# Patient Record
Sex: Male | Born: 1995 | Marital: Single | Smoking: Never smoker
Health system: Southern US, Community
[De-identification: ages and names within clinical notes are randomized; demographics above are authoritative.]

## PROBLEM LIST (undated history)

## (undated) DIAGNOSIS — T7840XA Allergy, unspecified, initial encounter: Secondary | ICD-10-CM

## (undated) HISTORY — DX: Allergy, unspecified, initial encounter: T78.40XA

---

## 2018-01-08 ENCOUNTER — Ambulatory Visit
Admission: RE | Admit: 2018-01-08 | Discharge: 2018-01-08 | Disposition: A | Discharge status: Home / Self Care | Payer: 59 | Source: Ambulatory Visit | Attending: Family Medicine | Admitting: Family Medicine

## 2018-01-08 ENCOUNTER — Other Ambulatory Visit: Payer: Self-pay | Admitting: Family Medicine

## 2018-01-08 ENCOUNTER — Ambulatory Visit
Admission: RE | Admit: 2018-01-08 | Discharge: 2018-01-08 | Disposition: A | Discharge status: Home / Self Care | Payer: 59 | Source: Ambulatory Visit | Attending: Radiology | Admitting: Radiology

## 2018-01-08 DIAGNOSIS — M7989 Other specified soft tissue disorders: Secondary | ICD-10-CM | POA: Insufficient documentation

## 2018-01-08 DIAGNOSIS — S99911A Unspecified injury of right ankle, initial encounter: Secondary | ICD-10-CM | POA: Insufficient documentation

## 2018-01-08 DIAGNOSIS — M25571 Pain in right ankle and joints of right foot: Secondary | ICD-10-CM | POA: Insufficient documentation

## 2018-01-08 DIAGNOSIS — X58XXXA Exposure to other specified factors, initial encounter: Secondary | ICD-10-CM | POA: Diagnosis not present

## 2018-02-09 ENCOUNTER — Ambulatory Visit: Payer: 59 | Attending: Medical

## 2018-02-09 DIAGNOSIS — M25671 Stiffness of right ankle, not elsewhere classified: Secondary | ICD-10-CM | POA: Diagnosis present

## 2018-02-09 NOTE — Therapy (Signed)
[facility] MAIN [facility] SERVICES [address] [place], [place], [address] Phone: [phone]   Fax:  [phone]  Physical Therapy Evaluation  Patient Details  Name: Preston Salinas MRN: [id number] Date of Birth: 12-30-1995 Referring Provider (PT): Jonathon Resides PA-C    Encounter Date: 02/09/2018  PT End of Session - 02/09/18 1721    Visit Number  1    Number of Visits  8    Date for PT Re-Evaluation  03/12/18    Authorization Time Period  02/09/18-03/12/18    PT Start Time  1600    PT Stop Time  1652    PT Time Calculation (min)  52 min    Activity Tolerance  Patient tolerated treatment well;No increased pain    Behavior During Therapy  [facility] for tasks assessed/performed       No past medical history on file.   There were no vitals filed for this visit.   Subjective Assessment - 02/09/18 1710    Subjective  Pt reports landing on his right foot in an inverted position after coming down from a jump, rolling his ankle and sustaining an inversion sprain. This even occurred on 01/07/18, pt reports immediate swelling and hearing an audible 'pop' sound. Pt reports not playing club soccer since, and has tried running one time 2 weeks after, but had some immediate pain. Pt reports having xrays done, imaging suggestive of some 'ligamentous injury' per patient report. Pt denies any prior injury to the Right ankle, but questionable prior left ankle sprain. Pt was initially on crutches, no boot use. Pt then was walking fine thereafter without crutches. Pt adopted an ASO style brace for walking, has not had any limitations getting around campus. Pt has been avoiding most of his lower extremity conditioning at the gym.    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     Limitations  Other (comment)   unable to run d/t pain   How long can you sit comfortably?  unlimited     How long can you stand comfortably?  unlimited     How long can you  walk comfortably?  unlimited     Diagnostic tests  xray negative for fracture     Patient Stated Goals  His goal is to be able to return to playing club soccer in the spring. Pt also would like to be able to return to running for fitness, typically around 2 miles, 3-5x weekly.     Currently in Pain?  No/denies         [facility] PT Assessment - 02/09/18 0001      Assessment   Medical Diagnosis  Right inversion ankle sprain    Referring Provider (PT)  Jonathon Resides PA-C     Onset Date/Surgical Date  01/07/18    Next MD Visit  as needed     Prior Therapy  none      Precautions   Precautions  None    Required Braces or Orthoses  --   none mandated, but pt wearing  acompression sleeve     Prior Function   Level of Independence  Independent    Vocation  Student    Vocation Requirements  SItting, Walking long distances     Leisure  club soccer, running for fitness         Examination:   A/ROM -Left Ankle Dorsiflexion: 20 degrees; Plantar flexion: 55 degrees  -Right Ankle Dorsiflexion: 21 degrees; Plantar flexion:  32 degrees   *pain at the anterior ankle capsule with plantar flexion P/ROM  -Right Ankle Dorsiflexion: 32 degrees -Left Ankle Dorsiflexion: 38 degrees   STRENGTH ASSESSMENT  -Ankle DF MMT: 5/5  -Seated Hip IR/ER MMT: 5/5 pain free, Bilaterally.   -Single Leg Heel Raise to failure:   Left: 35+x (appears near failure c subjective agreement);   Right: 35+x (minimal to no pain)  -Single Leg stance (wall leaning) ankle dorsiflexion to failure: Left 6x, Right 9x (heels 18-inches from wall)   -Figure 8-measurement of edema  Left ankle: 50cm, Right ankle 51cm  BALANCE/PROPRIOCEPTIVE -Single Leg Stance Balance: 30+sec Left , 30+sec Right (denies pain)  -Single Leg Stance Balance- Eyes closed: Left, 30+sec (mild instability s LOB); Right: 29sec c LOB -Single Leg Stance Balance- Airex Foam (eyes open); Left:30sec; Right 30sec c mild increased intrinsic motor activity  and soreness  Objective measurements completed on examination: See above findings.      [facility] Adult PT Treatment/Exercise - 02/09/18 0001      Exercises   Exercises  Ankle      Manual Therapy   Manual Therapy  Joint mobilization    Manual therapy comments  Ankle DIstraction Mobilization: 1x60 seconds force as tolerated    Joint Mobilization  Ankle Dorsal Glide + Dorsiflexion Grade III 1x30sec       Ankle Exercises: Stretches   Other Stretch  Seated Sagittal Plane Heel Slides:    10x5sec stretch in DF and PF   Other Stretch  Seated Transverse Plane Heel Slides:   10x5sec Eversion and Inversion     Ankle Exercises: Seated   Ankle Circles/Pumps  20 reps;AROM;Right   20x CW; 20x CCW            PT Education - 02/09/18 1716    Education Details  Explained safe use of ice as needed if swelling or soreness are provoked by evaluation or HEP. Asked to refrain from running at this time.     Person(s) Educated  Patient    Methods  Explanation    Comprehension  Verbalized understanding;Need further instruction          PT Long Term Goals - 02/09/18 1729      PT LONG TERM GOAL #1   Title  After 4 weeks patient will be independent in HEP for return to sport agility and single leg dynamic stability activity.     Status  New      PT LONG TERM GOAL #2   Title  After 4 weeks pt will be demonstrate understanding of return to running program as educated by PT.     Status  New      PT LONG TERM GOAL #3   Title  After 4 weeks patient will demonstrate symmetrical ankle dorsiflexion and plantar flexion ROM within 3 degrees of each.     Status  New      PT LONG TERM GOAL #4   Title  After 4 weeks patient will demonstrate medial and lateral single leg hop distances on the right leg within 10% of distances the left leg with stick landing.     Status  New             Plan - 02/09/18 1722    Clinical Impression Statement  Pt referred to Physical Therapy for a right ankle  inversion sprain 4W post injury, imaging negative for acute fracture. Pt endorses continued swelling, inability to participate in soccer, inability to run without pain. Examination reveal  remarkably high funcitoning in the right ankle complex in terms of strenth, proprrioception, and motor control. The patient continues to have mild swelling, as well as ROM limitations plantarflexion>dorsiflexion. Pt will benefit from skilled PT intervention to restrore Right ankle function to prior level of funciton in order to propare for return to running and game play.     Clinical Presentation  Stable    Clinical Presentation due to:  clinical tests and measures     Clinical Decision Making  Low    Rehab Potential  Excellent    PT Frequency  1x / week    PT Duration  4 weeks    PT Treatment/Interventions  Cryotherapy;Electrical Stimulation;Moist Heat;Functional mobility training;Gait training;Therapeutic activities;Therapeutic exercise;Patient/family education;Balance training;Manual techniques;Passive range of motion;Dry needling    PT Next Visit Plan  Y-balance Test, strength assessment of peroneals, ROM assessment of ankle inversion/eversion, HEP review, interventions to progress plantar flexion ROM, progress OKC HEP to CKC as tolerated.     PT Home Exercise Plan  *see attached handout (most OKC gentle mobility and AA/ROM activity)     Consulted and Agree with Plan of Care  Patient       Patient will benefit from skilled therapeutic intervention in order to improve the following deficits and impairments:  Decreased balance, Decreased activity tolerance, Decreased range of motion, Increased edema, Hypomobility, Pain  Visit Diagnosis: Stiffness of right ankle, not elsewhere classified - Plan: PT plan of care cert/re-cert     Problem List There are no active problems to display for this patient.  5:44 PM, 02/09/18 Rosamaria Lints, PT, DPT Physical Therapist - [facility]  Outpatient Physical Therapy- Main Campus [phone]     Rosamaria Lints 02/09/2018, 5:39 PM  [facility] MAIN [facility] SERVICES [address] [place], [place], [address] Phone: [phone]   Fax:  [phone]  Name: Preston Salinas MRN: [id number] Date of Birth: 08/17/1995

## 2018-02-12 ENCOUNTER — Encounter: Payer: 59 | Admitting: Physical Therapy

## 2018-02-18 ENCOUNTER — Ambulatory Visit: Payer: 59 | Admitting: Physical Therapy

## 2018-02-18 ENCOUNTER — Encounter: Payer: Self-pay | Admitting: Physical Therapy

## 2018-02-18 DIAGNOSIS — M25671 Stiffness of right ankle, not elsewhere classified: Secondary | ICD-10-CM | POA: Diagnosis not present

## 2018-02-18 NOTE — Patient Instructions (Signed)
Heel raises w/ ball between ankles to prevent inversion R foot soccer ball work GTB nkle PF, Eversion, Inversion

## 2018-02-18 NOTE — Therapy (Signed)
[facility] MAIN [facility] SERVICES [address] [place], [place], [address] Phone: [phone]   Fax:  [phone]  Physical Therapy Treatment  Patient Details  Name: Preston Salinas MRN: [id number] Date of Birth: 11/15/1995 Referring Provider (PT): Jonathon Resides PA-C    Encounter Date: 02/18/2018  PT End of Session - 02/18/18 1638    Visit Number  2    Number of Visits  8    Date for PT Re-Evaluation  03/12/18    Authorization Time Period  02/09/18-03/12/18    PT Start Time  1615    PT Stop Time  1645    PT Time Calculation (min)  30 min    Activity Tolerance  Patient tolerated treatment well;No increased pain    Behavior During Therapy  [facility] for tasks assessed/performed       History reviewed. No pertinent past medical history.  History reviewed. No pertinent surgical history.  There were no vitals filed for this visit.  Subjective Assessment - 02/18/18 1618    Subjective  Patient states that he is feeling much better and has returned to bodyweight exercises.     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     Limitations  Other (comment)   unable to run d/t pain   How long can you sit comfortably?  unlimited     How long can you stand comfortably?  unlimited     How long can you walk comfortably?  unlimited     Diagnostic tests  xray negative for fracture     Patient Stated Goals  His goal is to be able to return to playing club soccer in the spring. Pt also would like to be able to return to running for fitness, typically around 2 miles, 3-5x weekly.     Currently in Pain?  No/denies       TREATMENT  Therapeutic Exercise: (all exercises done barefoot) R Towel In/eversion x10 each B Heel Raises x10 B Heel raises w/ ball between ankles x30 R Ankle 3-way, GTB x5 each R Soccer ball touches (laces/cleats or dorsal/plantar) x30  Manual Therapy: Proximal Tib-fib AP gr 3 glides x30 sec bout, gr 2 glides x30 sec  bout Distal Tib-fib AP and superior gr 3 glides 2 x30 sec bout TC joint distraction, gentle x10 TC PA glide gr 3, x30 sec bout Subtalar Medial and Lateral glides gr 3, 2 x 30 sec bouts each  Patient reported decreased pain after manuals.    PT Long Term Goals - 02/09/18 1729      PT LONG TERM GOAL #1   Title  After 4 weeks patient will be independent in HEP for return to sport agility and single leg dynamic stability activity.     Status  New      PT LONG TERM GOAL #2   Title  After 4 weeks pt will be demonstrate understanding of return to running program as educated by PT.     Status  New      PT LONG TERM GOAL #3   Title  After 4 weeks patient will demonstrate symmetrical ankle dorsiflexion and plantar flexion ROM within 3 degrees of each.     Status  New      PT LONG TERM GOAL #4   Title  After 4 weeks patient will demonstrate medial and lateral single leg hop distances on the right leg within 10% of distances the left leg with stick landing.  Status  New         Plan - 02/18/18 1906    Clinical Impression Statement  Patient presents to clinic with improved function and activity tolerance and was amenable to therapy. Patient continues to demonstrate deficits in R ankle strength and motor control as a result of R ankle inversion sprain. Patient will benefit from continued skilled therapeutic intervention to address deficits in strength, motor control, and function in order to return to sport and improve overall QOL.     Rehab Potential  Excellent    PT Frequency  1x / week    PT Duration  4 weeks    PT Treatment/Interventions  Cryotherapy;Electrical Stimulation;Moist Heat;Functional mobility training;Gait training;Therapeutic activities;Therapeutic exercise;Patient/family education;Balance training;Manual techniques;Passive range of motion;Dry needling    PT Next Visit Plan  Y-balance Test, strength assessment of peroneals, ROM assessment of ankle inversion/eversion, HEP  review, interventions to progress plantar flexion ROM, progress OKC HEP to CKC as tolerated.     PT Home Exercise Plan  *see attached handout (most OKC gentle mobility and AA/ROM activity)     Consulted and Agree with Plan of Care  Patient       Patient will benefit from skilled therapeutic intervention in order to improve the following deficits and impairments:  Decreased balance, Decreased activity tolerance, Decreased range of motion, Increased edema, Hypomobility, Pain  Visit Diagnosis: Stiffness of right ankle, not elsewhere classified     Problem List There are no active problems to display for this patient.  Sheria Lang PT, DPT [phone] 02/18/2018, 7:16 PM  [facility] MAIN [facility] SERVICES [address] [place], [place], [address] Phone: [phone]   Fax:  [phone]  Name: Preston Salinas MRN: [id number] Date of Birth: 1995/07/07

## 2018-02-26 ENCOUNTER — Ambulatory Visit: Payer: 59

## 2018-02-26 DIAGNOSIS — M25671 Stiffness of right ankle, not elsewhere classified: Secondary | ICD-10-CM

## 2018-02-26 NOTE — Patient Instructions (Signed)
Access Code: [email or web address]  URL: [email or web address]  Date: 02/26/2018  Prepared by: Ria Comment   Exercises  Long Sitting Ankle Eversion with Resistance - 10 reps - 2 sets - 3 seconds hold - 1x daily - 7x weekly

## 2018-02-26 NOTE — Therapy (Addendum)
[facility] MAIN [facility] SERVICES [address] [place], [place], [address] Phone: [phone]   Fax:  [phone]  Physical Therapy Treatment  Patient Details  Name: Preston Salinas MRN: [id number] Date of Birth: 19-Dec-1995 Referring Provider (PT): Jonathon Resides PA-C    Encounter Date: 02/26/2018  PT End of Session - 02/26/18 1657    Visit Number  3    Number of Visits  8    Date for PT Re-Evaluation  03/12/18    Authorization Time Period  02/09/18-03/12/18    PT Start Time  1652    PT Stop Time  1730    PT Time Calculation (min)  38 min    Activity Tolerance  Patient tolerated treatment well;No increased pain    Behavior During Therapy  [facility] for tasks assessed/performed       History reviewed. No pertinent past medical history.  History reviewed. No pertinent surgical history.  There were no vitals filed for this visit.  Subjective Assessment - 02/26/18 1652    Subjective  Pt reports that he is doing well on this date. No pain currently. No specific questions or concerns. Pt reports compliance with HEP with some mild pain while performing exercises with soccer ball.     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     Limitations  Other (comment)   unable to run d/t pain   How long can you sit comfortably?  unlimited     How long can you stand comfortably?  unlimited     How long can you walk comfortably?  unlimited     Diagnostic tests  xray negative for fracture     Patient Stated Goals  His goal is to be able to return to playing club soccer in the spring. Pt also would like to be able to return to running for fitness, typically around 2 miles, 3-5x weekly.     Currently in Pain?  No/denies           TREATMENT   Therapeutic Exercise: (all exercises done barefoot) R ankle ligamentous laxity testing (see below) R Ankle 4-way, GTB x5 each Discontinued ball PF/DF juggling due to pain in anterior ankle during  PF; Airex on 6" step R single leg lateral squats with heel taps x 10, cues for very slow and controlled eccentric lowering; Airex R single leg balance with ball passes in a variety of planes to therapist 30s x 2; 1/2 foam roller tandem balance alternating forward LE 30s x 2 each; BOSU (flat side up) squats x 15; Pt issued theraband resisted R ankle eversion with dorsiflexion strengthening in upright or long sitting;   Manual Therapy: Talocrural joint distraction with oscillation 30s/bout x 3 bouts; Prone talocrural PA glide grade 2-3, 30s/bouts x 3 bouts; No pain with palpation along peroneal tendons, at base of 5th metatarsal, or at navicular; No pain along anterior tib tendons with palpation;   Ligamentous Integrity Special Tests (Right) Anterior Drawer (ATF, 10-15 plantarflexion with anterior translation): Negative Talar Tilt (CFL, inversion): Negative Eversion Stress Test (Deltoid, eversion): Negative External Rotation Test (High ankle, dorsiflexion and external rotation): Negative Squeeze Test (High ankle): Negative Impingment Sign (Dorsiflexion and eversion): Negative    Pt educated throughout session about proper posture and technique with exercises. Improved exercise technique, movement at target joints, use of target muscles after min to mod verbal, visual, tactile cues.   Patient presents to clinic with improving function. He reports pain with juggling  exercise when catching ball in fully plantarflexed position so this exercise was discontinued. Added seated dorsiflexion with eversion using theraband. He presents with decreased RLE endurance with dynamic balance on unstable surface. Patient will benefit from continued skilled therapeutic intervention to address deficits in strength, motor control, and function in order to return to sport and improve overall QOL.                       PT Long Term Goals - 02/09/18 1729      PT LONG TERM GOAL #1   Title   After 4 weeks patient will be independent in HEP for return to sport agility and single leg dynamic stability activity.     Status  New      PT LONG TERM GOAL #2   Title  After 4 weeks pt will be demonstrate understanding of return to running program as educated by PT.     Status  New      PT LONG TERM GOAL #3   Title  After 4 weeks patient will demonstrate symmetrical ankle dorsiflexion and plantar flexion ROM within 3 degrees of each.     Status  New      PT LONG TERM GOAL #4   Title  After 4 weeks patient will demonstrate medial and lateral single leg hop distances on the right leg within 10% of distances the left leg with stick landing.     Status  New            Plan - 02/26/18 1657    Rehab Potential  Excellent    PT Frequency  1x / week    PT Duration  4 weeks    PT Treatment/Interventions  Cryotherapy;Electrical Stimulation;Moist Heat;Functional mobility training;Gait training;Therapeutic activities;Therapeutic exercise;Patient/family education;Balance training;Manual techniques;Passive range of motion;Dry needling    PT Next Visit Plan  Y-balance Test, strength assessment of peroneals, ROM assessment of ankle inversion/eversion, HEP review, interventions to progress plantar flexion ROM, progress OKC HEP to CKC as tolerated.     PT Home Exercise Plan  *see attached handout (most OKC gentle mobility and AA/ROM activity)     Consulted and Agree with Plan of Care  Patient       Patient will benefit from skilled therapeutic intervention in order to improve the following deficits and impairments:  Decreased balance, Decreased activity tolerance, Decreased range of motion, Increased edema, Hypomobility, Pain  Visit Diagnosis: Stiffness of right ankle, not elsewhere classified     Problem List There are no active problems to display for this patient.  Lynnea Maizes PT, DPT, GCS  Hamdi Vari 02/27/2018, 9:13 AM  [place] MAIN  [facility] SERVICES [address] [place], [place], [address] Phone: [phone]   Fax:  [phone]  Name: Preston Salinas MRN: [id number] Date of Birth: November 19, 1995

## 2018-03-10 ENCOUNTER — Encounter: Payer: Self-pay | Admitting: Physical Therapy

## 2018-03-10 ENCOUNTER — Ambulatory Visit: Payer: 59 | Attending: Medical | Admitting: Physical Therapy

## 2018-03-10 DIAGNOSIS — M25671 Stiffness of right ankle, not elsewhere classified: Secondary | ICD-10-CM | POA: Insufficient documentation

## 2018-03-10 NOTE — Therapy (Signed)
[facility]IN [facility] [address] [place], [place], [address] Phone: [phone]   Fax:  [phone]  Physical Therapy Treatment  Patient Details  Name: Preston Salinas MRN: [id number] Date of Birth: [birth year] Referring Provider (PT): Jonathon ResidesKimberlee Butler PA-C    Encounter Date: 03/10/2018  PT End of Session - 03/10/18 1556    Visit Number  4    Number of Visits  8    Date for PT Re-Evaluation  03/12/18    Authorization Time Period  02/09/18-03/12/18    PT Start Time  1548    PT Stop Time  1644    PT Time Calculation (min)  56 min    Activity Tolerance  Patient tolerated treatment well;No increased pain    Behavior During Therapy  [facility]       History reviewed. No pertinent past medical history.  History reviewed. No pertinent surgical history.  There were no vitals filed for this visit.  Subjective Assessment - 03/10/18 1553    Subjective  Patient states that he is doing well, but you still can't run without pain increasing (6/10). If he discontinues running, the pain takes 5-10 minutes to resolve. Patient states that he has continued to do his exercises.    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     Limitations  Other (comment)   unable to run d/t pain   How long can you sit comfortably?  unlimited     How long can you stand comfortably?  unlimited     How long can you walk comfortably?  unlimited     Diagnostic tests  xray negative for fracture     Patient Stated Goals  His goal is to be able to return to playing club soccer in the spring. Pt also would like to be able to return to running for fitness, typically around 2 miles, 3-5x weekly.     Currently in Pain?  No/denies       TREATMENT  Therapeutic Exercise: (barefoot) Bil depth heel raises with ball between ankles, 6" step, 2x10 Dyna-disc R heel raises x15 Dyna-disc R flat foot balance 2x30 sec, 1x 1min  BOSU (round  side) weight shift x10 BOSU (round side) A/P rocks x10 BOSU (round side) prancing x 2.5 min (shoed) Skipping 40 ft x 6 Jogging 40 ft x 4  AROM L DF: 20* R DF: 20* L PF: 52* R PF: 53*  Patient had no increased pain during session and was able to maintain mechanics under increased demand.   Patient educated on return to running program, appropriate progressions, and modifying activities for his current level.     PT Long Term Goals - 03/10/18 1657      PT LONG TERM GOAL #1   Title  After 4 weeks patient will be independent in HEP for return to sport agility and single leg dynamic stability activity.     Status  Achieved    Target Date  03/12/18      PT LONG TERM GOAL #2   Title  After 4 weeks pt will be demonstrate understanding of return to running program as educated by PT.     Status  Achieved    Target Date  03/12/18      PT LONG TERM GOAL #3   Title  After 4 weeks patient will demonstrate symmetrical ankle dorsiflexion and plantar flexion ROM within 3 degrees of each.  Status  Achieved    Target Date  03/12/18      PT LONG TERM GOAL #4   Title  After 4 weeks patient will demonstrate medial and lateral single leg hop distances on the right leg within 10% of distances the left leg with stick landing.     Time  4    Period  Weeks    Status  On-going    Target Date  04/07/18      PT LONG TERM GOAL #5   Title  Patient will be able to perform dribbling and soccer ball handling with the RLE without increased pain and tenderness in order to return to sport.    Time  4    Period  Weeks    Status  New    Target Date  04/07/18         Plan - 03/10/18 1709    Clinical Impression Statement  Patient presents to clinic with continued improvements in function and activity tolerance and was amenable to therapy. Patient continues to be limited by deficits in R ankle motor control and stability during dyanmic activities in varied planes of motion, as well as pain during  sport-specific tasks such as ball handling with the RLE. Patient has started a return to running program but his deficits in motor control and stability are limiting his ability to perform lateral and medial single leg hops safely and without pain. Patient will continue to benefit from skilled therapeutic intervention to address remaining deficits and return to sport at his PLOF.    Rehab Potential  Excellent    PT Frequency  1x / week    PT Duration  4 weeks    PT Treatment/Interventions  Cryotherapy;Electrical Stimulation;Moist Heat;Functional mobility training;Gait training;Therapeutic activities;Therapeutic exercise;Patient/family education;Balance training;Manual techniques;Passive range of motion;Dry needling    PT Next Visit Plan  single-leg hopping medial/lateral, reassess ball handling skills    PT Home Exercise Plan  continue with previous, add proprioceptive exercises on unstable surfaces, skipping, jogging    Consulted and Agree with Plan of Care  Patient       Patient will benefit from skilled therapeutic intervention in order to improve the following deficits and impairments:  Decreased balance, Decreased activity tolerance, Decreased range of motion, Increased edema, Hypomobility, Pain  Visit Diagnosis: Stiffness of right ankle, not elsewhere classified     Problem List There are no active problems to display for this patient.  Sheria Lang PT, DPT [phone] 03/10/2018, 5:16 PM  [place] [facility] MAIN [facility] SERVICES [address] [place], [place], [address] Phone: [phone]   Fax:  [phone]  Name: Preston Salinas MRN: [id number] Date of Birth: 03-Sep-1995

## 2018-03-18 ENCOUNTER — Ambulatory Visit: Payer: 59

## 2018-03-18 DIAGNOSIS — M25671 Stiffness of right ankle, not elsewhere classified: Secondary | ICD-10-CM

## 2018-03-18 NOTE — Therapy (Signed)
[facility] [address] [place], [place], [address] Phone: [phone]   Fax:  [phone]  Physical Therapy Treatment  Patient Details  Name: Preston Salinas MRN: [id number] Date of Birth: 1996-02-15 Referring Provider (PT): Jonathon ResidesKimberlee Butler PA-C    Encounter Date: 03/18/2018  PT End of Session - 03/18/18 1606    Visit Number  5    Number of Visits  8    Date for PT Re-Evaluation  04/07/18    Authorization Time Period  03/10/18-04/07/18    PT Start Time  1604    PT Stop Time  1645    PT Time Calculation (min)  41 min    Activity Tolerance  Patient tolerated treatment well;No increased pain    Behavior During Therapy  [facility] for tasks assessed/performed       History reviewed. No pertinent past medical history.  History reviewed. No pertinent surgical history.  There were no vitals filed for this visit.  Subjective Assessment - 03/18/18 1606    Subjective  Patient states that he is doing well, but is still limited running secondary to pain. He has been doing some jogging since the last session. 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     Limitations  Other (comment)   unable to run d/t pain   How long can you sit comfortably?  unlimited     How long can you stand comfortably?  unlimited     How long can you walk comfortably?  unlimited     Diagnostic tests  xray negative for fracture     Patient Stated Goals  His goal is to be able to return to playing club soccer in the spring. Pt also would like to be able to return to running for fitness, typically around 2 miles, 3-5x weekly.     Currently in Pain?  No/denies          TREATMENT  Ther-ex  (barefoot) R single leg TRX pistol squats on Airex 2 x 10; R single leg russian unweighted dead lift x 10, standing on Airex pad x 10, pt  struggles with balance stability on Airex pad; Goblet squats (as low as possible) 15# kettlebell x 10, mild restriction noted in R ankle DF; R single leg Airex balance with CW/CCW ball circles using LLE x 10 each direction, 2 sets;  (with shoes) Skaters bounding right and left 2 x 10 each direction (no pain reported); Box Jumps to 12" aerobic step x 20, single leg box jumps to 10" step x 15; Skipping 40 ft x 4   Manual Therapy  STM to R fibularis longus/brevis muscles with notable trigger points, ischemic compression utilized;   Trigger Point Dry Needling (TDN) Education performed with patient regarding potential benefit of TDN. Reviewed precautions and risks with patient. Extensive time spent with pt to ensure full understanding of TDN risks. Pt provided verbal consent to treatment. TDN performed to R fibularis longus/brevis with 3, 0.25 x 40 single needle placements with local twitch response (LTR) during all three placements. Pistoning technique utilized. Improved pain-free motion following intervention.                           PT Long Term Goals - 03/10/18 1657      PT LONG TERM GOAL #  1   Title  After 4 weeks patient will be independent in HEP for return to sport agility and single leg dynamic stability activity.     Status  Achieved    Target Date  03/12/18      PT LONG TERM GOAL #2   Title  After 4 weeks pt will be demonstrate understanding of return to running program as educated by PT.     Status  Achieved    Target Date  03/12/18      PT LONG TERM GOAL #3   Title  After 4 weeks patient will demonstrate symmetrical ankle dorsiflexion and plantar flexion ROM within 3 degrees of each.     Status  Achieved    Target Date  03/12/18      PT LONG TERM GOAL #4   Title  After 4 weeks patient will demonstrate medial and lateral single leg hop distances on the right leg within 10% of distances the left leg with stick landing.     Time  4    Period  Weeks     Status  On-going    Target Date  04/07/18      PT LONG TERM GOAL #5   Title  Patient will be able to perform dribbling and soccer ball handling with the RLE without increased pain and tenderness in order to return to sport.    Time  4    Period  Weeks    Status  New    Target Date  04/07/18            Plan - 03/18/18 1611    Rehab Potential  Excellent    PT Frequency  1x / week    PT Duration  4 weeks    PT Treatment/Interventions  Cryotherapy;Electrical Stimulation;Moist Heat;Functional mobility training;Gait training;Therapeutic activities;Therapeutic exercise;Patient/family education;Balance training;Manual techniques;Passive range of motion;Dry needling    PT Next Visit Plan  single-leg hopping medial/lateral, reassess ball handling skills    PT Home Exercise Plan  continue with previous, add proprioceptive exercises on unstable surfaces, skipping, jogging    Consulted and Agree with Plan of Care  Patient       Patient will benefit from skilled therapeutic intervention in order to improve the following deficits and impairments:  Decreased balance, Decreased activity tolerance, Decreased range of motion, Increased edema, Hypomobility, Pain  Visit Diagnosis: Stiffness of right ankle, not elsewhere classified     Problem List There are no active problems to display for this patient.  Lynnea Maizes PT, DPT, GCS  Yeng Perz 03/18/2018, 5:29 PM  [place] [facility] MAIN [facility] SERVICES [address] [place], [place], [address] Phone: [phone]   Fax:  [phone]  Name: Preston Salinas MRN: [id number] Date of Birth: 1995/05/01

## 2018-04-02 ENCOUNTER — Ambulatory Visit: Payer: 59 | Attending: Medical | Admitting: Physical Therapy

## 2018-04-02 DIAGNOSIS — M25671 Stiffness of right ankle, not elsewhere classified: Secondary | ICD-10-CM | POA: Diagnosis present

## 2018-04-03 NOTE — Therapy (Addendum)
[place] [facility] MAIN [facility] SERVICES [address] [place], [place], [address] Phone: [phone]   Fax:  [phone]  Physical Therapy Treatment  Patient Details  Name: Preston Salinas MRN: [id number] Date of Birth: [birth year] Referring Provider (PT): Jonathon Resides PA-C    Encounter Date: 04/02/2018  PT End of Session - 04/02/18 1641    Visit Number  6    Number of Visits  8    Date for PT Re-Evaluation  04/07/18    Authorization Time Period  03/10/18-04/07/18    PT Start Time  1605    PT Stop Time  1645    PT Time Calculation (min)  40 min    Activity Tolerance  Patient tolerated treatment well;No increased pain    Behavior During Therapy  [facility] for tasks assessed/performed       History reviewed. No pertinent past medical history.  History reviewed. No pertinent surgical history.  There were no vitals filed for this visit.  Subjective Assessment - 04/02/18 1606    Subjective  Patient states that he is doing well and continues to jog short distances (~10 min) with no increased pain. Patient has no pain when using the elliptical. Patient has pain when plantar flexed at end range (7/10).     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     Limitations  Other (comment)   unable to run d/t pain   How long can you sit comfortably?  unlimited     How long can you stand comfortably?  unlimited     How long can you walk comfortably?  unlimited     Diagnostic tests  xray negative for fracture     Patient Stated Goals  His goal is to be able to return to playing club soccer in the spring. Pt also would like to be able to return to running for fitness, typically around 2 miles, 3-5x weekly.     Currently in Pain?  No/denies         TREATMENT  Therapeutic Exercise: BLE gastroc heel raises on compliant surface with ball squeeze x30 RLE soleus heel raises on compliant surface x30 Posterior pelvic tilt x10 w/ VCs to not recruit  glutes Pilates bridge w/ spinal articulation for improved proximal motor control x10 (Extremely challenging for patient to control.)  Patient educated extensively on benefits of developing more proximal control and stability as a strategy to off-load the ankle and improve overall sports performance.     PT Education - 04/02/18 1612    Education Details  exercise technique, activity modification    Person(s) Educated  Patient    Methods  Demonstration;Explanation;Verbal cues    Comprehension  Verbalized understanding;Need further instruction;Returned demonstration          PT Long Term Goals - 03/10/18 1657      PT LONG TERM GOAL #1   Title  After 4 weeks patient will be independent in HEP for return to sport agility and single leg dynamic stability activity.     Status  Achieved    Target Date  03/12/18      PT LONG TERM GOAL #2   Title  After 4 weeks pt will be demonstrate understanding of return to running program as educated by PT.     Status  Achieved    Target Date  03/12/18      PT LONG TERM GOAL #3   Title  After 4 weeks patient will demonstrate  symmetrical ankle dorsiflexion and plantar flexion ROM within 3 degrees of each.     Status  Achieved    Target Date  03/12/18      PT LONG TERM GOAL #4   Title  After 4 weeks patient will demonstrate medial and lateral single leg hop distances on the right leg within 10% of distances the left leg with stick landing.     Time  4    Period  Weeks    Status  On-going    Target Date  04/07/18      PT LONG TERM GOAL #5   Title  Patient will be able to perform dribbling and soccer ball handling with the RLE without increased pain and tenderness in order to return to sport.    Time  4    Period  Weeks    Status  New    Target Date  04/07/18            Plan - 04/03/18 1021    Clinical Impression Statement  Patient presents to clinic with improved overall function with deficits remaining in pain at end range  plantarflexion on the R ankle. Patient also demonstrates deficits in proximal motor control as evidenced by his inability to control his lumbopelvic complex during bridging with spinal articulation and accessory muscle use during pelvic tilts. Patient will continue to benefit from skilled therapeutic intervention to address remaining deficits and return to PLOF and pain free sport activity.   Rehab Potential  Excellent    PT Frequency  1x / week    PT Duration  4 weeks    PT Treatment/Interventions  Cryotherapy;Electrical Stimulation;Moist Heat;Functional mobility training;Gait training;Therapeutic activities;Therapeutic exercise;Patient/family education;Balance training;Manual techniques;Passive range of motion;Dry needling    PT Next Visit Plan  single-leg hopping medial/lateral, reassess ball handling skills    PT Home Exercise Plan  continue with previous, add proprioceptive exercises on unstable surfaces, skipping, jogging    Consulted and Agree with Plan of Care  Patient       Patient will benefit from skilled therapeutic intervention in order to improve the following deficits and impairments:  Decreased balance, Decreased activity tolerance, Decreased range of motion, Increased edema, Hypomobility, Pain  Visit Diagnosis: Stiffness of right ankle, not elsewhere classified     Problem List There are no active problems to display for this patient.  Sheria LangKatlin Belina Mandile PT, DPT [phone] 04/03/2018, 10:22 AM  [place] [facility] [address] [place], [place], [address] Phone: [phone]   Fax:  [phone]  Name: Cassandria AngerCharles Essner MRN: [id number] Date of Birth: Apr 09, 1996

## 2018-04-09 ENCOUNTER — Ambulatory Visit: Payer: 59

## 2018-04-09 DIAGNOSIS — M25671 Stiffness of right ankle, not elsewhere classified: Secondary | ICD-10-CM | POA: Diagnosis not present

## 2018-04-09 NOTE — Therapy (Signed)
[facility] MAIN [facility] SERVICES [address] [place], [place], [address] Phone: [phone]   Fax:  [phone]  Physical Therapy Treatment/Discharge  Dates of reporting period  02/09/18   to   04/09/18  Patient Details  Name: Preston Salinas MRN: [id number] Date of Birth: 09-Apr-1996 Referring Provider (PT): Jonathon Resides PA-C    Encounter Date: 04/09/2018  PT End of Session - 04/13/18 1435    Visit Number  7    Number of Visits  12    Date for PT Re-Evaluation  05/07/18    Authorization Time Period  03/10/18-04/07/18    PT Start Time  1645    PT Stop Time  1735    PT Time Calculation (min)  50 min    Activity Tolerance  Patient tolerated treatment well;No increased pain    Behavior During Therapy  [facility] for tasks assessed/performed       History reviewed. No pertinent past medical history.  History reviewed. No pertinent surgical history.  There were no vitals filed for this visit.  Subjective Assessment - 04/13/18 1437    Subjective  Patient states that he is able to run at a moderate speed for 15-20 minutes before he has to stop due to pain. Pt with no increased pain currently. He will be leaving to go back home for the holidays tomorrow. He has not tried kicking a soccer ball since the injury.     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     Limitations  Other (comment)   unable to run d/t pain   How long can you sit comfortably?  unlimited     How long can you stand comfortably?  unlimited     How long can you walk comfortably?  unlimited     Diagnostic tests  xray negative for fracture     Patient Stated Goals  His goal is to be able to return to playing club soccer in the spring. Pt also would like to be able to return to running for fitness, typically around 2 miles, 3-5x weekly.     Currently in Pain?  No/denies        TREATMENT  Manual Therapy   Performed special testing with patient which  included:  Ligamentous Integrity Anterior Drawer (ATF, 10-15 plantarflexion with anterior translation): Positive for pain but no laxity Talar Tilt (CFL, inversion): Negative Eversion Stress Test (Deltoid, eversion): Negative External Rotation Test (High ankle, dorsiflexion and external rotation): Negative Squeeze Test (High ankle): Negative Impingment Sign (Dorsiflexion and eversion): Negative  Achilles Integrity Thompson Test: Negative  Fracture Screening Metatarsal Axial Loading: Negative Tap/Percussion Test: Negative Vibration Test: Negative  Pronation/Supination Navicular Drop: Not examined  Nerve Test Tarsal Tunnel Test (maximal DF, EV, toe ext with tapping over tarsal tunnel): Negative Test for Morton's Neuroma (compress metatarsals and mobilize): Not examined  R fibula on tibia anterior to posterior mobilizations, grade II-III, 30s/bout x 3 bouts, initially mildly painful but improves with repeated mobilizations; R fibula on tibia anterior to posterior mobilization with movement 2 x 10, mild increase in pain initially but improves with repetition;  Ther-ex  R single leg bridges 2 x 10; L single leg balance with RLE soccer kicks in the air in full plantarflexion with therapist, no pain reported 2 x 10; Extensive review of HEP with patient including running/jogging, soccer drills, aerobic exercise, and plyometric exercises.  Updated goals with patient;   Pt educated throughout session about proper posture  and technique with exercises. Improved exercise technique, movement at target joints, use of target muscles after min to mod verbal, visual, tactile cues.     Patient presents to clinic with improving function. He reports that he has almost returned to normal but continues to have some pain with jogging and has not tried kicking with his RLE for fear of increased pain. Discussed calling his PCP to discuss repeat imaging of R ankle if pain doesn't continue to improve over  the next couple of weeks. All special testing is negative today of the ankle with the exception of anterior drawer which is mildly painful but not hypermobile. Pain with palpation over ATF. He is able to perform kicking with RLE today with therapist while R ankle is in a fully plantarflexed position without any increase in pain. Pt will return home over the break and continue strengthening with his trainer. He will call his PCP when he returns if he feels like he needs additional therapy however plan is for discharge today.                           PT Long Term Goals - 04/13/18 1438      PT LONG TERM GOAL #1   Title  After 4 weeks patient will be independent in HEP for return to sport agility and single leg dynamic stability activity.     Status  Achieved      PT LONG TERM GOAL #2   Title  After 4 weeks pt will be demonstrate understanding of return to running program as educated by PT.     Status  Achieved      PT LONG TERM GOAL #3   Title  After 4 weeks patient will demonstrate symmetrical ankle dorsiflexion and plantar flexion ROM within 3 degrees of each.     Status  Achieved      PT LONG TERM GOAL #4   Title  After 4 weeks patient will demonstrate medial and lateral single leg hop distances on the right leg within 10% of distances the left leg with stick landing.     Time  4    Period  Weeks    Status  Deferred      PT LONG TERM GOAL #5   Title  Patient will be able to perform dribbling and soccer ball handling with the RLE without increased pain and tenderness in order to return to sport.    Time  4    Period  Weeks    Status  Achieved            Plan - 04/13/18 1435    Clinical Impression Statement  Patient presents to clinic with improving function. He reports that he has almost returned to normal but continues to have some pain with jogging and has not tried kicking with his RLE for fear of increased pain. Discussed calling his PCP to discuss  repeat imaging of R ankle if pain doesn't continue to improve over the next couple of weeks. All special testing is negative today of the ankle with the exception of anterior drawer which is mildly painful but not hypermobile. Pain with palpation over ATF. He is able to perform kicking with RLE today with therapist while R ankle is in a fully plantarflexed position without any increase in pain. Pt will return home over the break and continue strengthening with his trainer. He will call his PCP when he returns if  he feels like he needs additional therapy however plan is for discharge today.     Rehab Potential  Excellent    PT Frequency  1x / week    PT Duration  4 weeks    PT Treatment/Interventions  Cryotherapy;Electrical Stimulation;Moist Heat;Functional mobility training;Gait training;Therapeutic activities;Therapeutic exercise;Patient/family education;Balance training;Manual techniques;Passive range of motion;Dry needling    PT Next Visit Plan  single-leg hopping medial/lateral, reassess ball handling skills    PT Home Exercise Plan  continue with previous, add proprioceptive exercises on unstable surfaces, skipping, jogging    Consulted and Agree with Plan of Care  Patient       Patient will benefit from skilled therapeutic intervention in order to improve the following deficits and impairments:  Decreased balance, Decreased activity tolerance, Decreased range of motion, Increased edema, Hypomobility, Pain  Visit Diagnosis: Stiffness of right ankle, not elsewhere classified     Problem List There are no active problems to display for this patient.  Lynnea Maizes PT, DPT, GCS  Deveney Bayon 04/13/2018, 2:59 PM  [facility] MAIN [facility] SERVICES [address] [place], [place], [address] Phone: [phone]   Fax:  [phone]  Name: Preston Salinas MRN: [id number] Date of Birth: [birth year]

## 2018-04-16 ENCOUNTER — Ambulatory Visit: Payer: 59

## 2018-05-21 ENCOUNTER — Other Ambulatory Visit (HOSPITAL_COMMUNITY): Payer: Self-pay | Admitting: Sports Medicine

## 2018-05-21 ENCOUNTER — Other Ambulatory Visit: Payer: Self-pay | Admitting: Sports Medicine

## 2018-05-21 DIAGNOSIS — G8929 Other chronic pain: Secondary | ICD-10-CM

## 2018-05-21 DIAGNOSIS — M25571 Pain in right ankle and joints of right foot: Principal | ICD-10-CM

## 2018-06-10 ENCOUNTER — Ambulatory Visit
Admission: RE | Admit: 2018-06-10 | Discharge: 2018-06-10 | Disposition: A | Discharge status: Home / Self Care | Payer: 59 | Source: Ambulatory Visit | Attending: Sports Medicine | Admitting: Sports Medicine

## 2018-06-10 DIAGNOSIS — G8929 Other chronic pain: Secondary | ICD-10-CM

## 2018-06-10 DIAGNOSIS — M25571 Pain in right ankle and joints of right foot: Secondary | ICD-10-CM | POA: Diagnosis present

## 2019-02-11 DIAGNOSIS — F419 Anxiety disorder, unspecified: Secondary | ICD-10-CM

## 2019-02-11 HISTORY — DX: Anxiety disorder, unspecified: F41.9

## 2019-07-18 ENCOUNTER — Ambulatory Visit: Payer: 59 | Attending: Internal Medicine

## 2019-07-18 DIAGNOSIS — Z23 Encounter for immunization: Secondary | ICD-10-CM

## 2019-07-18 NOTE — Progress Notes (Signed)
   Covid-19 Vaccination Clinic  Name:  Hameed Kolar    MRN: [id number] DOB: [birth year]  07/18/2019  Mr. Liebig was observed post Covid-19 immunization for 15 minutes without incident. He was provided with Vaccine Information Sheet and instruction to access the V-Safe system.   Mr. Biven was instructed to call 911 with any severe reactions post vaccine: Marland Kitchen Difficulty breathing  . Swelling of face and throat  . A fast heartbeat  . A bad rash all over body  . Dizziness and weakness   Immunizations Administered    Name Date Dose VIS Date Route   Pfizer COVID-19 Vaccine 07/18/2019  6:43 PM 0.3 mL 04/09/2019 Intramuscular   Manufacturer: [employer], [employer]   Lot: [id number]   NDC: [address]-1

## 2019-07-19 ENCOUNTER — Ambulatory Visit: Payer: 59 | Attending: Sports Medicine

## 2019-07-19 ENCOUNTER — Other Ambulatory Visit: Payer: Self-pay

## 2019-07-19 DIAGNOSIS — M25571 Pain in right ankle and joints of right foot: Secondary | ICD-10-CM | POA: Diagnosis present

## 2019-07-19 DIAGNOSIS — M25671 Stiffness of right ankle, not elsewhere classified: Secondary | ICD-10-CM | POA: Diagnosis present

## 2019-07-19 NOTE — Patient Instructions (Addendum)
Pt was recommended to use arch supports for his feet to help decrease anterior lateral and posterior ankle pain when running

## 2019-07-19 NOTE — Therapy (Signed)
[place] [place] [facility] PHYSICAL AND SPORTS MEDICINE [address], [place], [address] Phone: [phone]   Fax:  [phone]  Physical Therapy Evaluation  Patient Details  Name: Preston Salinas  MRN: [id number] Date of Birth: 08/12/1995 Referring Provider (PT): Dorthula Nettles, DO   Encounter Date: 07/19/2019  PT End of Session - 07/19/19 1352    Visit Number  1    Number of Visits  13    Date for PT Re-Evaluation  09/02/19    PT Start Time  1352    PT Stop Time  1503    PT Time Calculation (min)  71 min    Activity Tolerance  Patient tolerated treatment well    Behavior During Therapy  [facility] for tasks assessed/performed       Past Medical History:  Diagnosis Date  . Allergy   . Anxiety 02/11/2019    History reviewed. No pertinent surgical history.  There were no vitals filed for this visit.   Subjective Assessment - 07/19/19 1355    Subjective  R ankle (ATFL): 0/10 currently; 2/10 at most for the past 3 weeks (when running for about 1 min. Would be more if he kept running).    Pertinent History  Chronic R ankle pain. Pt injured his R ankle around 2019 and reinjured it June 11, 2019. Pain has improved quite a bit but still gives him discomfort when running. Pt still goes to the gym and is fine. Does squats, deadlifts, lunges. Can run at the treadmill for about a minute until his R ankle bothers him. A brisk walk is fine. Pt injured R ankle playing soccer and his R foot got stuck in a pot hole in mud.  The PT in 2019 helped with his R ankle. Has not yet had PT for his reinjury. Was told that he has a sprain. No tears or fractures. R ankle swelled up at time of injury.    Patient Stated Goals  Play soccer again, no pain. Complete PT. Be able to run again without pain.    Currently in Pain?  No/denies    Pain Score  0-No pain    Pain Location  Ankle    Pain Orientation  Anterior;Lateral    Pain Descriptors / Indicators   Sore;Tightness;Stabbing    Pain Type  Acute pain   acute on chronic   Pain Onset  More than a month ago    Pain Frequency  Occasional    Aggravating Factors   running at least 1 min (also feels posterior ankle pain when landing as well as ATFL pain), jumping, kicking with R foot.    Pain Relieving Factors  ice, propping his foot up, moving his ankle around in circles         [facility] PT Assessment - 07/19/19 1407      Assessment   Medical Diagnosis  Chronic R ankld pain; R ankle sprain    Referring Provider (PT)  Dorthula Nettles, DO    Onset Date/Surgical Date  07/12/19    Prior Therapy  yes for same ankle 2019 with positive results      Precautions   Precaution Comments  No  known precautions      Restrictions   Other Position/Activity Restrictions  no known restrictions      Prior Function   Level of Independence  Independent    Vocation  Student    Vocation Requirements  SItting, Walking long distances     Leisure  club soccer, running for fitness       Observation/Other Assessments   Observations  R anterior lateral ankle swelling.     Focus on Therapeutic Outcomes (FOTO)   R ankld FOTO 67      Posture/Postural Control   Posture Comments  R shoulder lower, slight L lateral shift with R trunk rotation, B foot pronation R > L      AROM   Right Ankle Dorsiflexion  15    Right Ankle Plantar Flexion  60      PROM   Overall PROM Comments  hip IR at 90/90: 42 degrees R, 36 degrees L; hip ER [facility] bilaterally       Strength   Right Hip Flexion  4+/5    Right Hip Extension  4+/5    Right Hip External Rotation   4/5    Right Hip Internal Rotation  4+/5    Right Hip ABduction  5/5    Left Hip Flexion  4+/5    Left Hip Extension  4+/5    Left Hip External Rotation  4/5    Left Hip Internal Rotation  4/5    Left Hip ABduction  5/5    Right Knee Flexion  5/5   Increased lateral hamstring pull palpated   Right Knee Extension  5/5    Left Knee Flexion  5/5    Left Knee  Extension  5/5    Right Ankle Plantar Flexion  --    Right Ankle Inversion  4+/5    Right Ankle Eversion  4+/5    Left Ankle Dorsiflexion  4+/5    Left Ankle Inversion  5/5    Left Ankle Eversion  4+/5      Special Tests   Other special tests  (-) Long sit test; negative anterior drawer      Ambulation/Gait   Gait Comments  no antalgic pattern, decreased R hip IR compared to L during stance phase.                 Objective measurements completed on examination: See above findings.    No latex band allergies    Manual therapy   Low dye tape to R foot to support plantar arch   Therapeutic exercise  Treadmill jog x 1 min speed 6.5  posterior ankle symptoms/tightness/discomfort  Then treadmill jog x 4 min after low dye taping speed 6.5  No pain or R ankle symptoms.     Patient is a 24 year old male who came to physical therapy secondary to acute on chronic R ankle pain secondary to a sprain in 05/2019. He also presents with R anterior lateral and posterior ankle symptoms, R anterior lateral ankle swelling, B foot pronation posture R > L, decreased L hip mobility, B glute max weakness, and difficulty performing tasks which involve running and jumping secondary to ankle pain. Pt will benefit from skilled physical therapy services to address the aforementioned deficits.             PT Education - 07/19/19 1526    Education Details  arch support to decrease stress to injured area while jogging. Plan of care    Person(s) Educated  Patient    Methods  Explanation;Demonstration;Tactile cues;Verbal cues    Comprehension  Returned demonstration;Verbalized understanding       PT Short Term Goals - 07/19/19 1527      PT SHORT TERM GOAL #1   Title  Patient will be independent with his  HEP to decrease pain, and improve ability to run and jump without pain.    Time  3    Period  Weeks    Status  New    Target Date  08/12/19        PT Long Term Goals -  07/19/19 1527      PT LONG TERM GOAL #1   Title  Patient will have a decrease in R ankle pain to 1/10 or less at worst to promote ability to run, jump, and exercise more comfortably.    Baseline  2/10 R ankle pain at most for the past 3 weeks (higher pain level if pt runs for greater than 1 minute; 07/19/2019)    Time  6    Period  Weeks    Status  New    Target Date  09/02/19      PT LONG TERM GOAL #2   Title  Patient will be able to run for at least 15 minutes without complain of R ankle pain to promote cardiovascular fitness and return to soccer.    Baseline  Pain increases to at least 2/10 when running for about 1 minute (3/22/201)    Time  6    Period  Weeks    Status  New    Target Date  09/02/19      PT LONG TERM GOAL #3   Title  Patient will improve his R ankle FOTO score by at least 10 points as a demonstration of improved function.    Baseline  R ankle FOTO: 67 (07/19/2019)    Time  6    Period  Weeks    Status  New    Target Date  09/02/19              Plan - 07/19/19 1521    Clinical Impression Statement  Patient is a 24 year old male who came to physical therapy secondary to acute on chronic R ankle pain secondary to a sprain in 05/2019. He also presents with R anterior lateral and posterior ankle symptoms, R anterior lateral ankle swelling, B foot pronation posture R > L, decreased L hip mobility, B glute max weakness, and difficulty performing tasks which involve running and jumping secondary to ankle pain. Pt will benefit from skilled physical therapy services to address the aforementioned deficits.    Personal Factors and Comorbidities  Past/Current Experience;Time since onset of injury/illness/exacerbation    Examination-Activity Limitations  Other   running and jumping   Stability/Clinical Decision Making  Stable/Uncomplicated    Clinical Decision Making  Low    Clinical Presentation due to:  Pain improving since recent onset in February per pt    Rehab  Potential  Good    PT Frequency  2x / week    PT Duration  6 weeks    PT Treatment/Interventions  Therapeutic exercise;Therapeutic activities;Functional mobility training;Balance training;Neuromuscular re-education;Patient/family education;Manual techniques;Dry needling;Taping;Spinal Manipulations;Joint Manipulations;Aquatic Therapy;Electrical Stimulation;Iontophoresis 4mg /ml Dexamethasone;Ultrasound    PT Next Visit Plan  concentric, eccentric glute and ankle strengthening, ankle stability, manual techniques, modalities PRN    Consulted and Agree with Plan of Care  Patient       Patient will benefit from skilled therapeutic intervention in order to improve the following deficits and impairments:  Pain, Postural dysfunction, Improper body mechanics, Decreased strength  Visit Diagnosis: Pain in right ankle and joints of right foot - Plan: PT plan of care cert/re-cert     Problem List There are no problems to  display for this patient.   Loralyn Freshwater PT, DPT   07/19/2019, 3:48 PM  [facility] [place] [facility] PHYSICAL AND SPORTS MEDICINE [address], [place], [address] Phone: [phone]   Fax:  [phone]  Name: Harald Quevedo MRN: [id number] Date of Birth: 1995/07/08

## 2019-07-22 ENCOUNTER — Ambulatory Visit (INDEPENDENT_AMBULATORY_CARE_PROVIDER_SITE_OTHER): Payer: 59 | Admitting: Dermatology

## 2019-07-22 ENCOUNTER — Other Ambulatory Visit: Payer: Self-pay

## 2019-07-22 DIAGNOSIS — R21 Rash and other nonspecific skin eruption: Secondary | ICD-10-CM | POA: Diagnosis not present

## 2019-07-22 DIAGNOSIS — B079 Viral wart, unspecified: Secondary | ICD-10-CM | POA: Diagnosis not present

## 2019-07-22 DIAGNOSIS — L720 Epidermal cyst: Secondary | ICD-10-CM

## 2019-07-22 MED ORDER — MOMETASONE FUROATE 0.1 % EX CREA: 1.0000 "application " | TOPICAL_CREAM | Freq: Every day | CUTANEOUS | 0 refills | Status: AC | PRN

## 2019-07-22 NOTE — Progress Notes (Signed)
   Follow-Up Visit   Subjective  Preston Salinas is a 24 y.o. male who presents for the following: Follow-up (Wart of left thumb - Treated with LN2, Candida 0.1cc, Squaric acid and Cantherone. Seems gone.), Cyst (right infra auricular - was inflamed. He took Doxycycline 100mg  bid x 1 week and it seems resolved.), and Rash (neck x 2-3 weeks. It is dry and he thinks it may be related to a necklace that he wears.).    The following portions of the chart were reviewed this encounter and updated as appropriate:     Review of Systems: No other skin or systemic complaints.  Objective  Well appearing patient in no apparent distress; mood and affect are within normal limits.  A focused examination was performed including left thumb, face, neck. Relevant physical exam findings are noted in the Assessment and Plan.  Objective  Right infra auricular: 40mm pink papule. Resolving.  Objective  Right Anterior Neck: Pink patches.  Objective  Left Thumb Tip: Clear today.  Assessment & Plan  Epidermal inclusion cyst Right infra auricular Now calm.  Advised this may recur and may require excision in the future. Observe Discussed risk of recurrence.  Rash Right lateral neck Contact dermatitis versus eczema mometasone (ELOCON) 0.1 % cream - Right Anterior Neck  Viral warts, unspecified type Left Thumb Tip Resolved but advised may recur. Observe Discussed risk of recurrence.  Return if symptoms worsen or fail to improve.   I, 3m, CMA, am acting as scribe for Joanie Coddington, MD .

## 2019-07-27 ENCOUNTER — Other Ambulatory Visit: Payer: Self-pay

## 2019-07-27 ENCOUNTER — Ambulatory Visit: Payer: 59

## 2019-07-27 DIAGNOSIS — M25571 Pain in right ankle and joints of right foot: Secondary | ICD-10-CM | POA: Diagnosis not present

## 2019-07-27 DIAGNOSIS — M25671 Stiffness of right ankle, not elsewhere classified: Secondary | ICD-10-CM

## 2019-07-27 NOTE — Therapy (Signed)
[facility] [address] [place], [address] Phone: [phone]   Fax:  [phone]  Physical Therapy Treatment  Patient Details  Name: Preston Salinas MRN: [id number] Date of Birth: 01-27-1996 Referring Provider (PT): Rosalia Hammers, DO   Encounter Date: 07/27/2019  PT End of Session - 07/27/19 1750    Visit Number  2    Number of Visits  13    Date for PT Re-Evaluation  09/02/19    PT Start Time  1750    PT Stop Time  1838    PT Time Calculation (min)  48 min    Activity Tolerance  Patient tolerated treatment well    Behavior During Therapy  [facility] for tasks assessed/performed       Past Medical History:  Diagnosis Date  . Allergy   . Anxiety 02/11/2019    No past surgical history on file.  There were no vitals filed for this visit.  Subjective Assessment - 07/27/19 1751    Subjective  R ankle feels fine. No pain currently. Not noticeable but still there.    Pertinent History  Chronic R ankle pain. Pt injured his R ankle around 2019 and reinjured it June 11, 2019. Pain has improved quite a bit but still gives him discomfort when running. Pt still goes to the gym and is fine. Does squats, deadlifts, lunges. Can run at the treadmill for about a minute until his R ankle bothers him. A brisk walk is fine. Pt injured R ankle playing soccer and his R foot got stuck in a pot hole in mud.  The PT in 2019 helped with his R ankle. Has not yet had PT for his reinjury. Was told that he has a sprain. No tears or fractures. R ankle swelled up at time of injury.    Patient Stated Goals  Play soccer again, no pain. Complete PT. Be able to run again without pain.    Currently in Pain?  No/denies    Pain Score  0-No pain    Pain Onset  More than a month ago                               PT Education - 07/27/19 1831    Education Details  ther-ex, massaging R ankle    Person(s) Educated   Patient    Methods  Explanation;Demonstration;Tactile cues;Verbal cues    Comprehension  Returned demonstration;Verbalized understanding      Objective     No latex band allergies  Pt wants to be able to kick a soccer ball hard to shoot the ball.      Manual therapy            Seated STM R tibialis anterior to decrease tension   Prone STM to decrease fascial tension to posterior ankle, medial and lateral calcaneous and ankle, Achilles tension, and lateral dorsal foot.   Therapeutic exercise   Seated manually resisted R ankle DF and IV 10x3 each way   Treadmill jog x 1 min speed 6.5             posterior ankle symptoms/tightness/discomfort    After STM to decrease fascial tension R ankle:  Then treadmill jog x 5 min after low dye taping speed 6.5             Minimal to no R ankle symptoms.   Medial calcaneal pinpoint  symptoms now instead of wrapping around the posterior ankle joint    Improved exercise technique, movement at target joints, use of target muscles after min verbal, visual, tactile cues.    Response to treatment Decreased R posterior ankle symptoms while jogging after session.   Clinical impression Pt demonstrates fascial tension posterior R ankle. Performed manual therapy to improve mobility which helped pt jog more comfortably at the treadmill. Pt will benefit from continued skilled physical therapy services to decrease pain, improve strength, function, and ability to play sports.      PT Short Term Goals - 07/19/19 1527      PT SHORT TERM GOAL #1   Title  Patient will be independent with his HEP to decrease pain, and improve ability to run and jump without pain.    Time  3    Period  Weeks    Status  New    Target Date  08/12/19        PT Long Term Goals - 07/19/19 1527      PT LONG TERM GOAL #1   Title  Patient will have a decrease in R ankle pain to 1/10 or less at worst to promote ability to run, jump, and exercise more  comfortably.    Baseline  2/10 R ankle pain at most for the past 3 weeks (higher pain level if pt runs for greater than 1 minute; 07/19/2019)    Time  6    Period  Weeks    Status  New    Target Date  09/02/19      PT LONG TERM GOAL #2   Title  Patient will be able to run for at least 15 minutes without complain of R ankle pain to promote cardiovascular fitness and return to soccer.    Baseline  Pain increases to at least 2/10 when running for about 1 minute (3/22/201)    Time  6    Period  Weeks    Status  New    Target Date  09/02/19      PT LONG TERM GOAL #3   Title  Patient will improve his R ankle FOTO score by at least 10 points as a demonstration of improved function.    Baseline  R ankle FOTO: 67 (07/19/2019)    Time  6    Period  Weeks    Status  New    Target Date  09/02/19            Plan - 07/27/19 1832    Clinical Impression Statement  Pt demonstrates fascial tension posterior R ankle. Performed manual therapy to improve mobility which helped pt jog more comfortably at the treadmill. Pt will benefit from continued skilled physical therapy services to decrease pain, improve strength, function, and ability to play sports.    Personal Factors and Comorbidities  Past/Current Experience;Time since onset of injury/illness/exacerbation    Examination-Activity Limitations  Other   running and jumping   Stability/Clinical Decision Making  Stable/Uncomplicated    Rehab Potential  Good    PT Frequency  2x / week    PT Duration  6 weeks    PT Treatment/Interventions  Therapeutic exercise;Therapeutic activities;Functional mobility training;Balance training;Neuromuscular re-education;Patient/family education;Manual techniques;Dry needling;Taping;Spinal Manipulations;Joint Manipulations;Aquatic Therapy;Electrical Stimulation;Iontophoresis 4mg /ml Dexamethasone;Ultrasound    PT Next Visit Plan  concentric, eccentric glute and ankle strengthening, ankle stability, manual techniques,  modalities PRN    Consulted and Agree with Plan of Care  Patient  Patient will benefit from skilled therapeutic intervention in order to improve the following deficits and impairments:  Pain, Postural dysfunction, Improper body mechanics, Decreased strength  Visit Diagnosis: Pain in right ankle and joints of right foot  Stiffness of right ankle, not elsewhere classified     Problem List There are no problems to display for this patient.   Loralyn Freshwater PT, DPT   07/27/2019, 6:55 PM  [facility] [address] [place], [address] Phone: [phone]   Fax:  [phone]  Name: Dequincy Born MRN: [id number] Date of Birth: 28-Feb-1996

## 2019-07-27 NOTE — Patient Instructions (Signed)
Pt was recommended to massage his R posterior ankle area to help decrease stiffness. Pt verbalized understanding.

## 2019-07-28 ENCOUNTER — Ambulatory Visit: Payer: 59

## 2019-07-28 DIAGNOSIS — M25571 Pain in right ankle and joints of right foot: Secondary | ICD-10-CM | POA: Diagnosis not present

## 2019-07-28 DIAGNOSIS — M25671 Stiffness of right ankle, not elsewhere classified: Secondary | ICD-10-CM

## 2019-07-28 NOTE — Therapy (Signed)
[facility] [address], [place], [address] Phone: [phone]   Fax:  [phone]  Physical Therapy Treatment  Patient Details  Name: Preston Salinas MRN: [id number] Date of Birth: [birth year] Referring Provider (PT): Dorthula Nettles, DO   Encounter Date: 07/28/2019  PT End of Session - 07/28/19 1603    Visit Number  3    Number of Visits  13    Date for PT Re-Evaluation  09/02/19    PT Start Time  1603    PT Stop Time  1655    PT Time Calculation (min)  52 min    Activity Tolerance  Patient tolerated treatment well    Behavior During Therapy  [facility] for tasks assessed/performed       Past Medical History:  Diagnosis Date  . Allergy   . Anxiety 02/11/2019    No past surgical history on file.  There were no vitals filed for this visit.  Subjective Assessment - 07/28/19 1604    Subjective  Just worked out 2 hours ago. Did some jogging back and forth. Felt wrap around pain when he pushed off during the first few steps. Kind of subsided a little as he kept going.    Pertinent History  Chronic R ankle pain. Pt injured his R ankle around 2019 and reinjured it June 11, 2019. Pain has improved quite a bit but still gives him discomfort when running. Pt still goes to the gym and is fine. Does squats, deadlifts, lunges. Can run at the treadmill for about a minute until his R ankle bothers him. A brisk walk is fine. Pt injured R ankle playing soccer and his R foot got stuck in a pot hole in mud.  The PT in 2019 helped with his R ankle. Has not yet had PT for his reinjury. Was told that he has a sprain. No tears or fractures. R ankle swelled up at time of injury.    Patient Stated Goals  Play soccer again, no pain. Complete PT. Be able to run again without pain.    Currently in Pain?  No/denies    Pain Score  0-No pain   at rest   Pain Onset  More than a month ago                                PT Education - 07/28/19 1722    Education Details  ther-ex    Person(s) Educated  Patient    Methods  Explanation;Demonstration;Tactile cues;Verbal cues    Comprehension  Returned demonstration;Verbalized understanding       Objective     No latex band allergies  Pt wants to be able to kick a soccer ball hard to shoot the ball.      Manual therapy  Prone STM to decrease fascial tension to posterior ankle, medial and lateral calcaneous and ankle, Achilles tendion, and lateral dorsal foot And plantar fascia  No improvement in posterior medial ankle pain with jogging Try STM L lateral hamstrings next visit if appropriate   Therapeutic exercise  Jog: R heel pain at initial push-off  Medial calcaneal area. Not a wrap around pain anymore   Heel walk 32 ft x 4  Slight decrease in L medial calcaneal pain  Eccentric heel raise at stair step 2x. R posterior tibialis discomfort.   Increased R foot pronation observed.   Eccentric heel raises 10x  Eccentric IV manually resisted 10x2 to promote proper stress to tibialis posterior tendon (no pain compared to concentric IV).      Improved exercise technique, movement at target joints, use of target muscles after min to mod verbal, visual, tactile cues.     Response to treatment Improved fascial mobility after manual therapy. No improvement in heel pain after STM.    Clinical impression Pt continues to demonstrate medial and posterior calcaneal symptoms during push off phase of running. No longer has the posterior lateral calcaneal pain based on pt subjective. No improvement in symptoms with manual therapy today. Possible tibialis posterior tendon involvement secondary to foot pronation during gastroc stretch with pt reporting discomfort along the muscle. Increased foot pronation observed. Pt was recommended to wear arch supports in his shoe to prevent  excessive foot pronation when running. Pt verbalized understanding. Pt will benefit from continued skilled physical therapy services to decrease pain, improve strength, function and decrease difficulty with running.     PT Short Term Goals - 07/19/19 1527      PT SHORT TERM GOAL #1   Title  Patient will be independent with his HEP to decrease pain, and improve ability to run and jump without pain.    Time  3    Period  Weeks    Status  New    Target Date  08/12/19        PT Long Term Goals - 07/19/19 1527      PT LONG TERM GOAL #1   Title  Patient will have a decrease in R ankle pain to 1/10 or less at worst to promote ability to run, jump, and exercise more comfortably.    Baseline  2/10 R ankle pain at most for the past 3 weeks (higher pain level if pt runs for greater than 1 minute; 07/19/2019)    Time  6    Period  Weeks    Status  New    Target Date  09/02/19      PT LONG TERM GOAL #2   Title  Patient will be able to run for at least 15 minutes without complain of R ankle pain to promote cardiovascular fitness and return to soccer.    Baseline  Pain increases to at least 2/10 when running for about 1 minute (3/22/201)    Time  6    Period  Weeks    Status  New    Target Date  09/02/19      PT LONG TERM GOAL #3   Title  Patient will improve his R ankle FOTO score by at least 10 points as a demonstration of improved function.    Baseline  R ankle FOTO: 67 (07/19/2019)    Time  6    Period  Weeks    Status  New    Target Date  09/02/19            Plan - 07/28/19 1718    Clinical Impression Statement  Pt continues to demonstrate medial and posterior calcaneal symptoms during push off phase of running. No longer has the posterior lateral calcaneal pain based on pt subjective. No improvement in symptoms with manual therapy today. Possible tibialis posterior tendon involvement secondary to foot pronation during gastroc stretch with pt reporting discomfort along the  muscle. Increased foot pronation observed. Pt was recommended to wear arch supports in his shoe to prevent excessive foot pronation when running. Pt verbalized understanding. Pt will benefit from continued skilled physical  therapy services to decrease pain, improve strength, function and decrease difficulty with running.    Personal Factors and Comorbidities  Past/Current Experience;Time since onset of injury/illness/exacerbation    Examination-Activity Limitations  Other   running and jumping   Stability/Clinical Decision Making  Stable/Uncomplicated    Rehab Potential  Good    PT Frequency  2x / week    PT Duration  6 weeks    PT Treatment/Interventions  Therapeutic exercise;Therapeutic activities;Functional mobility training;Balance training;Neuromuscular re-education;Patient/family education;Manual techniques;Dry needling;Taping;Spinal Manipulations;Joint Manipulations;Aquatic Therapy;Electrical Stimulation;Iontophoresis 4mg /ml Dexamethasone;Ultrasound    PT Next Visit Plan  concentric, eccentric glute and ankle strengthening, ankle stability, manual techniques, modalities PRN    Consulted and Agree with Plan of Care  Patient       Patient will benefit from skilled therapeutic intervention in order to improve the following deficits and impairments:  Pain, Postural dysfunction, Improper body mechanics, Decreased strength  Visit Diagnosis: Pain in right ankle and joints of right foot  Stiffness of right ankle, not elsewhere classified     Problem List There are no problems to display for this patient.   PT, DPT   07/28/2019, 5:23 PM  [facility] [address], [place] Phone: [phone]   Fax:  [phone]  Name: Preston Salinas MRN: Cassandria Anger Date of Birth: June 30, 1995

## 2019-08-04 ENCOUNTER — Other Ambulatory Visit: Payer: Self-pay

## 2019-08-04 ENCOUNTER — Ambulatory Visit: Payer: 59 | Attending: Sports Medicine

## 2019-08-04 DIAGNOSIS — M25571 Pain in right ankle and joints of right foot: Secondary | ICD-10-CM | POA: Diagnosis not present

## 2019-08-04 DIAGNOSIS — M25671 Stiffness of right ankle, not elsewhere classified: Secondary | ICD-10-CM | POA: Diagnosis present

## 2019-08-04 NOTE — Therapy (Signed)
[place] [facility] [address], [place], [address] Phone: [phone]   Fax:  [phone]  Physical Therapy Treatment  Patient Details  Name: Preston Salinas MRN: [id number] Date of Birth: 05/10/1995 Referring Provider (PT): Rosalia Hammers, DO   Encounter Date: 08/04/2019  PT End of Session - 08/04/19 1436    Visit Number  4    Number of Visits  13    Date for PT Re-Evaluation  09/02/19    Authorization Time Period  03/10/18-04/07/18    PT Start Time  1430    PT Stop Time  1512    PT Time Calculation (min)  42 min    Activity Tolerance  Patient tolerated treatment well    Behavior During Therapy  [facility] for tasks assessed/performed       Past Medical History:  Diagnosis Date  . Allergy   . Anxiety 02/11/2019    History reviewed. No pertinent surgical history.  There were no vitals filed for this visit.  Subjective Assessment - 08/04/19 1433    Subjective  The reported that he is doing well today.    Pertinent History  Chronic R ankle pain. Pt injured his R ankle around 2019 and reinjured it June 11, 2019. Pain has improved quite a bit but still gives him discomfort when running. Pt still goes to the gym and is fine. Does squats, deadlifts, lunges. Can run at the treadmill for about a minute until his R ankle bothers him. A brisk walk is fine. Pt injured R ankle playing soccer and his R foot got stuck in a pot hole in mud.  The PT in 2019 helped with his R ankle. Has not yet had PT for his reinjury. Was told that he has a sprain. No tears or fractures. R ankle swelled up at time of injury.    Limitations  Other (comment)   unable to run or play   How long can you sit comfortably?  unlimited     How long can you stand comfortably?  unlimited     How long can you walk comfortably?  unlimited     Diagnostic tests  xray negative for fracture     Patient Stated Goals  Play soccer again, no pain. Complete PT. Be  able to run again without pain.    Currently in Pain?  No/denies                      Objective        No latex band allergies    Pt wants to be able to kick a soccer ball hard to shoot the ball.       Manual therapy:      Self mobilization with golf ball of R arch x2 mins   No improvement in posterior medial ankle pain with jogging    Therapeutic exercise   Walk on treadmill 2 mins Jog on treadmill 5 mins  Assessment of foot position and shoes.   Arch lifts in standing x20    standing heel raises bilaterally with cues to push through big toe x15  Reported pain Standing heel raises after STM with improvement Standing eccentric heel raises: bilateral for concentric movement, single leg lowering (R leg) x20   calf stretch on incline of wood 3x 1 min  Improved exercise technique, movement at target joints, use of target muscles after min to mod verbal, visual, tactile cues.    Clinical  impression/pt response: Patient reported with jogging, his pain localized laterally this session about 1 minute into jogging. Did not worsen over time, but stated he was able to feel it throughout the jog. Pt and PT discussed importance onf arch lifts, supportive shoes, and condition. The patient was also instructed in arch rolling and tennis ball STM at home. The patient would benefit from further skilled PT intervention to return to PLOF.        PT Education - 08/04/19 1432    Education Details  ther-ex    Person(s) Educated  Patient    Methods  Explanation;Demonstration;Tactile cues;Verbal cues    Comprehension  Verbalized understanding;Returned demonstration       PT Short Term Goals - 07/19/19 1527      PT SHORT TERM GOAL #1   Title  Patient will be independent with his HEP to decrease pain, and improve ability to run and jump without pain.    Time  3    Period  Weeks    Status  New    Target Date  08/12/19        PT Long Term Goals - 07/19/19 1527       PT LONG TERM GOAL #1   Title  Patient will have a decrease in R ankle pain to 1/10 or less at worst to promote ability to run, jump, and exercise more comfortably.    Baseline  2/10 R ankle pain at most for the past 3 weeks (higher pain level if pt runs for greater than 1 minute; 07/19/2019)    Time  6    Period  Weeks    Status  New    Target Date  09/02/19      PT LONG TERM GOAL #2   Title  Patient will be able to run for at least 15 minutes without complain of R ankle pain to promote cardiovascular fitness and return to soccer.    Baseline  Pain increases to at least 2/10 when running for about 1 minute (3/22/201)    Time  6    Period  Weeks    Status  New    Target Date  09/02/19      PT LONG TERM GOAL #3   Title  Patient will improve his R ankle FOTO score by at least 10 points as a demonstration of improved function.    Baseline  R ankle FOTO: 67 (07/19/2019)    Time  6    Period  Weeks    Status  New    Target Date  09/02/19            Plan - 08/04/19 1435    Clinical Impression Statement  Patient reported with jogging, his pain localized laterally this session about 1 minute into jogging. Did not worsen over time, but stated he was able to feel it throughout the jog. Pt and PT discussed importance onf arch lifts, supportive shoes, and condition. The patient was also instructed in arch rolling and tennis ball STM at home. The patient would benefit from further skilled PT intervention to return to PLOF.    Personal Factors and Comorbidities  Past/Current Experience;Time since onset of injury/illness/exacerbation    Examination-Activity Limitations  Other   running and jumping   Stability/Clinical Decision Making  Stable/Uncomplicated    Rehab Potential  Good    PT Frequency  2x / week    PT Duration  6 weeks    PT Treatment/Interventions  Therapeutic exercise;Therapeutic activities;Functional  mobility training;Balance training;Neuromuscular  re-education;Patient/family education;Manual techniques;Dry needling;Taping;Spinal Manipulations;Joint Manipulations;Aquatic Therapy;Electrical Stimulation;Iontophoresis 4mg /ml Dexamethasone;Ultrasound    PT Next Visit Plan  concentric, eccentric glute and ankle strengthening, ankle stability, manual techniques, modalities PRN    PT Home Exercise Plan  continue with previous, add proprioceptive exercises on unstable surfaces, skipping, jogging    Consulted and Agree with Plan of Care  Patient       Patient will benefit from skilled therapeutic intervention in order to improve the following deficits and impairments:  Pain, Postural dysfunction, Improper body mechanics, Decreased strength  Visit Diagnosis: Pain in right ankle and joints of right foot  Stiffness of right ankle, not elsewhere classified     Problem List There are no problems to display for this patient.   08/04/2019, 3:18 PM  [facility] [address], [place] Phone: [phone]   Fax:  [phone]  Name: Preston Salinas MRN: Cassandria Anger Date of Birth: 1995/10/19

## 2019-08-08 ENCOUNTER — Ambulatory Visit: Payer: 59 | Attending: Internal Medicine

## 2019-08-08 DIAGNOSIS — Z23 Encounter for immunization: Secondary | ICD-10-CM

## 2019-08-08 NOTE — Progress Notes (Signed)
   Covid-19 Vaccination Clinic  Name:  Preston Salinas    MRN: [id number] DOB: March 01, 1996  08/08/2019  Preston Salinas was observed post Covid-19 immunization for 15 minutes without incident. He was provided with Vaccine Information Sheet and instruction to access the V-Safe system.   Preston Salinas was instructed to call 911 with any severe reactions post vaccine: Marland Kitchen Difficulty breathing  . Swelling of face and throat  . A fast heartbeat  . A bad rash all over body  . Dizziness and weakness   Immunizations Administered    Name Date Dose VIS Date Route   Pfizer COVID-19 Vaccine 08/08/2019  5:49 PM 0.3 mL 04/09/2019 Intramuscular   Manufacturer: [employer], [employer]   Lot: [id number]   NDC: [address]-6

## 2019-08-10 ENCOUNTER — Encounter: Payer: 59 | Admitting: Dermatology

## 2019-08-11 ENCOUNTER — Ambulatory Visit: Payer: 59

## 2019-08-11 ENCOUNTER — Other Ambulatory Visit: Payer: Self-pay

## 2019-08-11 DIAGNOSIS — M25571 Pain in right ankle and joints of right foot: Secondary | ICD-10-CM | POA: Diagnosis not present

## 2019-08-11 DIAGNOSIS — M25671 Stiffness of right ankle, not elsewhere classified: Secondary | ICD-10-CM

## 2019-08-11 NOTE — Therapy (Signed)
[facility] [address], [place], [address] Phone: [phone]   Fax:  [phone]  Physical Therapy Treatment  Patient Details  Name: Preston Salinas MRN: [id number] Date of Birth: 1995/12/12 Referring Provider (PT): Dorthula Nettles, DO   Encounter Date: 08/11/2019  PT End of Session - 08/11/19 1518    Visit Number  5    Number of Visits  13    Date for PT Re-Evaluation  09/02/19    Authorization Time Period  --    PT Start Time  1518    PT Stop Time  1600    PT Time Calculation (min)  42 min    Activity Tolerance  Patient tolerated treatment well    Behavior During Therapy  [facility] for tasks assessed/performed       Past Medical History:  Diagnosis Date  . Allergy   . Anxiety 02/11/2019    No past surgical history on file.  There were no vitals filed for this visit.  Subjective Assessment - 08/11/19 1519    Subjective  R foot is the same. No pain currently. Still has pain with push-off. Was not able to get arch support.    Pertinent History  Chronic R ankle pain. Pt injured his R ankle around 2019 and reinjured it June 11, 2019. Pain has improved quite a bit but still gives him discomfort when running. Pt still goes to the gym and is fine. Does squats, deadlifts, lunges. Can run at the treadmill for about a minute until his R ankle bothers him. A brisk walk is fine. Pt injured R ankle playing soccer and his R foot got stuck in a pot hole in mud.  The PT in 2019 helped with his R ankle. Has not yet had PT for his reinjury. Was told that he has a sprain. No tears or fractures. R ankle swelled up at time of injury.    Limitations  Other (comment)   unable to run or play   How long can you sit comfortably?  unlimited     How long can you stand comfortably?  unlimited     How long can you walk comfortably?  unlimited     Diagnostic tests  xray negative for fracture     Patient Stated Goals  Play soccer  again, no pain. Complete PT. Be able to run again without pain.    Currently in Pain?  No/denies    Pain Score  0-No pain                               PT Education - 08/11/19 1555    Education Details  ther-ex, HEP    Person(s) Educated  Patient    Methods  Explanation;Demonstration;Tactile cues;Verbal cues;Handout    Comprehension  Returned demonstration;Verbalized understanding      Objective     No latex band allergies  Pt wants to be able to kick a soccer ball hard to shoot the ball.  Medbridge Access Code [email or web address]  Manual therapy  Prone STM to decrease fascial tension to posterior tibialis and medial ankle area.   Decreased discomfort with jogging  Therapeutic exercise  Seated R ankle IV isometrics at 40% effort x 1 min 5x  Reviewed and given as part of his HEP. Pt demonstrated and verbalized understanding. Handout provided.    Eccentric PF and IV 10x3 manually resisted by PT  Jog  32 ft x 3. No pain at initial push off. Symptoms during 3rd lap of 32 ft.   Pt education on fascial restrictions as well as tendon healing and stress   Improved exercise technique, movement at target joints, use of target muscles after mod verbal, visual, tactile cues.     Response to treatment No pain with intial push-off R foot during jog after treatment.   Clinical impression Decreased fascial mobility palpated L medial ankle. Worked on STM to help address. Provided isometric and eccentric loading to R tibialis posterior tendon to promote proper healing of tissue. Pt able to jog without pain during initial push-off. Pt able to jog about 80-96 ft prior to feeling symptoms after treatment. Pt will benefit from continued skilled physical therapy services to decrease pain, improve strength and function.     PT Short Term Goals - 07/19/19 1527      PT SHORT TERM GOAL #1   Title  Patient will be independent with his HEP to decrease  pain, and improve ability to run and jump without pain.    Time  3    Period  Weeks    Status  New    Target Date  08/12/19        PT Long Term Goals - 07/19/19 1527      PT LONG TERM GOAL #1   Title  Patient will have a decrease in R ankle pain to 1/10 or less at worst to promote ability to run, jump, and exercise more comfortably.    Baseline  2/10 R ankle pain at most for the past 3 weeks (higher pain level if pt runs for greater than 1 minute; 07/19/2019)    Time  6    Period  Weeks    Status  New    Target Date  09/02/19      PT LONG TERM GOAL #2   Title  Patient will be able to run for at least 15 minutes without complain of R ankle pain to promote cardiovascular fitness and return to soccer.    Baseline  Pain increases to at least 2/10 when running for about 1 minute (3/22/201)    Time  6    Period  Weeks    Status  New    Target Date  09/02/19      PT LONG TERM GOAL #3   Title  Patient will improve his R ankle FOTO score by at least 10 points as a demonstration of improved function.    Baseline  R ankle FOTO: 67 (07/19/2019)    Time  6    Period  Weeks    Status  New    Target Date  09/02/19            Plan - 08/11/19 1516    Clinical Impression Statement  Decreased fascial mobility palpated L medial ankle. Worked on STM to help address. Provided isometric and eccentric loading to R tibialis posterior tendon to promote proper healing of tissue. Pt able to jog without pain during initial push-off. Pt able to jog about 80-96 ft prior to feeling symptoms after treatment. Pt will benefit from continued skilled physical therapy services to decrease pain, improve strength and function.    Personal Factors and Comorbidities  Past/Current Experience;Time since onset of injury/illness/exacerbation    Examination-Activity Limitations  Other   running and jumping   Stability/Clinical Decision Making  Stable/Uncomplicated    Rehab Potential  Good    PT Frequency  2x / week     PT Duration  6 weeks    PT Treatment/Interventions  Therapeutic exercise;Therapeutic activities;Functional mobility training;Balance training;Neuromuscular re-education;Patient/family education;Manual techniques;Dry needling;Taping;Spinal Manipulations;Joint Manipulations;Aquatic Therapy;Electrical Stimulation;Iontophoresis 4mg /ml Dexamethasone;Ultrasound    PT Next Visit Plan  concentric, eccentric glute and ankle strengthening, ankle stability, manual techniques, modalities PRN    PT Home Exercise Plan  continue with previous, add proprioceptive exercises on unstable surfaces, skipping, jogging    Consulted and Agree with Plan of Care  Patient       Patient will benefit from skilled therapeutic intervention in order to improve the following deficits and impairments:  Pain, Postural dysfunction, Improper body mechanics, Decreased strength  Visit Diagnosis: Pain in right ankle and joints of right foot  Stiffness of right ankle, not elsewhere classified     Problem List There are no problems to display for this patient.   Joneen Boers PT, DPT   08/11/2019, 7:03 PM  [place] [facility] [address] [place], [address] Phone: [phone]   Fax:  [phone]  Name: Preston Salinas MRN: [id number] Date of Birth: [birth year]

## 2019-08-18 ENCOUNTER — Ambulatory Visit: Payer: 59

## 2019-08-19 ENCOUNTER — Other Ambulatory Visit: Payer: Self-pay

## 2019-08-19 ENCOUNTER — Ambulatory Visit: Payer: 59

## 2019-08-19 DIAGNOSIS — M25571 Pain in right ankle and joints of right foot: Secondary | ICD-10-CM | POA: Diagnosis not present

## 2019-08-19 DIAGNOSIS — M25671 Stiffness of right ankle, not elsewhere classified: Secondary | ICD-10-CM

## 2019-08-19 NOTE — Therapy (Signed)
[facility] [address], [place], [address] Phone: [phone]   Fax:  [phone]  Physical Therapy Treatment  Patient Details  Name: Preston Salinas MRN: [id number] Date of Birth: 05/19/1995 Referring Provider (PT): Dorthula Nettles, DO   Encounter Date: 08/19/2019  PT End of Session - 08/19/19 1034    Visit Number  6    Number of Visits  13    Date for PT Re-Evaluation  09/02/19    PT Start Time  1034    PT Stop Time  1115    PT Time Calculation (min)  41 min    Activity Tolerance  Patient tolerated treatment well    Behavior During Therapy  [facility] for tasks assessed/performed       Past Medical History:  Diagnosis Date  . Allergy   . Anxiety 02/11/2019    No past surgical history on file.  There were no vitals filed for this visit.  Subjective Assessment - 08/19/19 1035    Subjective  Got the Dr. Jari Sportsman arch supports. Feels a little different. Pt ran at the treadmill at 7 mph for about 2 minutes without R ankle pain.    Pertinent History  Chronic R ankle pain. Pt injured his R ankle around 2019 and reinjured it June 11, 2019. Pain has improved quite a bit but still gives him discomfort when running. Pt still goes to the gym and is fine. Does squats, deadlifts, lunges. Can run at the treadmill for about a minute until his R ankle bothers him. A brisk walk is fine. Pt injured R ankle playing soccer and his R foot got stuck in a pot hole in mud.  The PT in 2019 helped with his R ankle. Has not yet had PT for his reinjury. Was told that he has a sprain. No tears or fractures. R ankle swelled up at time of injury.    Limitations  Other (comment)   unable to run or play   How long can you sit comfortably?  unlimited     How long can you stand comfortably?  unlimited     How long can you walk comfortably?  unlimited     Diagnostic tests  xray negative for fracture     Patient Stated Goals  Play soccer  again, no pain. Complete PT. Be able to run again without pain.    Currently in Pain?  No/denies                               PT Education - 08/19/19 1220    Education Details  ther-ex, STM to decrease fascial restrictions    Person(s) Educated  Patient    Methods  Explanation;Demonstration;Tactile cues;Verbal cues    Comprehension  Returned demonstration;Verbalized understanding      Objective     No latex band allergies  Pt wants to be able to kick a soccer ball hard to shoot the ball.  Medbridge Access Code [email or web address]  Manual therapy  Prone STM to decrease fascial tension to posterior tibialis, medial and lateral ankle and dorsolateral foot area.              No pain with jogging after manual therapy and eccentric loading exercises.   Prone with R knee bent: effleurage to decrease R lateral ankle swelling.   Therapeutic exercise   Jog 32 ft x 4. Symptoms at initial push off  but not the rest of the jog at start of session  Standing eccentric heel raise 10x. Symptoms at end range PF position  Prone R ankle eccentric PF with knee bent 10x2  Prone R ankle eccentric PF with IV to target tibialis posterior 10x2  Standing eccentric heel raise again 10x, no pain  Towards end of session: Jog 32 ft x 4. No pain    Work on ankle stability next session if appropriate     Improved exercise technique, movement at target joints, use of target muscles after mod verbal, visual, tactile cues.     Response to treatment No pain after session.   Clinical impression  No pain with jog 32 ft x 4 after treatment to decrease fascial restrictions and promote eccentric loading to tibialis posterior muscle tendon. Pt will benefit from continued skilled physical therapy services to decrease pain, improve strength, function and ability to run and kick a soccer ball without pain.        PT Short Term Goals - 07/19/19 1527       PT SHORT TERM GOAL #1   Title  Patient will be independent with his HEP to decrease pain, and improve ability to run and jump without pain.    Time  3    Period  Weeks    Status  New    Target Date  08/12/19        PT Long Term Goals - 07/19/19 1527      PT LONG TERM GOAL #1   Title  Patient will have a decrease in R ankle pain to 1/10 or less at worst to promote ability to run, jump, and exercise more comfortably.    Baseline  2/10 R ankle pain at most for the past 3 weeks (higher pain level if pt runs for greater than 1 minute; 07/19/2019)    Time  6    Period  Weeks    Status  New    Target Date  09/02/19      PT LONG TERM GOAL #2   Title  Patient will be able to run for at least 15 minutes without complain of R ankle pain to promote cardiovascular fitness and return to soccer.    Baseline  Pain increases to at least 2/10 when running for about 1 minute (3/22/201)    Time  6    Period  Weeks    Status  New    Target Date  09/02/19      PT LONG TERM GOAL #3   Title  Patient will improve his R ankle FOTO score by at least 10 points as a demonstration of improved function.    Baseline  R ankle FOTO: 67 (07/19/2019)    Time  6    Period  Weeks    Status  New    Target Date  09/02/19            Plan - 08/19/19 1221    Clinical Impression Statement  No pain with jog 32 ft x 4 after treatment to decrease fascial restrictions and promote eccentric loading to tibialis posterior muscle tendon. Pt will benefit from continued skilled physical therapy services to decrease pain, improve strength, function and ability to run and kick a soccer ball without pain.    Personal Factors and Comorbidities  Past/Current Experience;Time since onset of injury/illness/exacerbation    Examination-Activity Limitations  Other   running and jumping   Stability/Clinical Decision Making  Stable/Uncomplicated  Rehab Potential  Good    PT Frequency  2x / week    PT Duration  6 weeks    PT  Treatment/Interventions  Therapeutic exercise;Therapeutic activities;Functional mobility training;Balance training;Neuromuscular re-education;Patient/family education;Manual techniques;Dry needling;Taping;Spinal Manipulations;Joint Manipulations;Aquatic Therapy;Electrical Stimulation;Iontophoresis 4mg /ml Dexamethasone;Ultrasound    PT Next Visit Plan  concentric, eccentric glute and ankle strengthening, ankle stability, manual techniques, modalities PRN    PT Home Exercise Plan  continue with previous, add proprioceptive exercises on unstable surfaces, skipping, jogging    Consulted and Agree with Plan of Care  Patient       Patient will benefit from skilled therapeutic intervention in order to improve the following deficits and impairments:  Pain, Postural dysfunction, Improper body mechanics, Decreased strength  Visit Diagnosis: Pain in right ankle and joints of right foot  Stiffness of right ankle, not elsewhere classified     Problem List There are no problems to display for this patient.   Joneen Boers PT, DPT   08/19/2019, 12:30 PM  [place] [facility] [address] [place], [address] Phone: [phone]   Fax:  [phone]  Name: Preston Salinas MRN: [id number] Date of Birth: 1996-03-03

## 2019-08-24 ENCOUNTER — Telehealth: Payer: Self-pay

## 2019-08-24 ENCOUNTER — Ambulatory Visit: Payer: 59

## 2019-08-24 NOTE — Telephone Encounter (Signed)
No show. Called patient who said that he did not realize that he had an appointment today. Will be able to make it to his next scheduled session, Thursday at 10:30 am. R ankle is doing good. Was able to jog with slight discomfort. Doing his exercises. Trying to massage his ankle area superficially. Pt was also recommended to perform eccentric heel raises comfortably 10x3. Pt verbalized understanding.

## 2019-08-26 ENCOUNTER — Other Ambulatory Visit: Payer: Self-pay

## 2019-08-26 ENCOUNTER — Ambulatory Visit: Payer: 59

## 2019-08-26 DIAGNOSIS — M25571 Pain in right ankle and joints of right foot: Secondary | ICD-10-CM

## 2019-08-26 DIAGNOSIS — M25671 Stiffness of right ankle, not elsewhere classified: Secondary | ICD-10-CM

## 2019-08-26 NOTE — Therapy (Signed)
[facility] [address] [place], [address] Phone: [phone]   Fax:  [phone]  Physical Therapy Treatment  Patient Details  Name: Preston Salinas MRN: [id number] Date of Birth: 21-May-1995 Referring Provider (PT): Dorthula Nettles, DO   Encounter Date: 08/26/2019  PT End of Session - 08/26/19 1112    Visit Number  7    Number of Visits  13    Date for PT Re-Evaluation  09/02/19    PT Start Time  1030    PT Stop Time  1115    PT Time Calculation (min)  45 min    Activity Tolerance  Patient tolerated treatment well    Behavior During Therapy  [facility] for tasks assessed/performed       Past Medical History:  Diagnosis Date  . Allergy   . Anxiety 02/11/2019    History reviewed. No pertinent surgical history.  There were no vitals filed for this visit.  Subjective Assessment - 08/26/19 1035    Subjective  Patient reported that he was able to play soccer yesterday for 25 minutes. Stated he was fatigued (hasn't played in a while), and the ankle started to bother him at around 15 minutes, and then on and off for the last 25 minutes. Did wear his arch supports. rolled his L ankle.    Pertinent History  Chronic R ankle pain. Pt injured his R ankle around 2019 and reinjured it June 11, 2019. Pain has improved quite a bit but still gives him discomfort when running. Pt still goes to the gym and is fine. Does squats, deadlifts, lunges. Can run at the treadmill for about a minute until his R ankle bothers him. A brisk walk is fine. Pt injured R ankle playing soccer and his R foot got stuck in a pot hole in mud.  The PT in 2019 helped with his R ankle. Has not yet had PT for his reinjury. Was told that he has a sprain. No tears or fractures. R ankle swelled up at time of injury.    Limitations  Other (comment)   unable to run or play   How long can you sit comfortably?  unlimited     How long can you stand  comfortably?  unlimited     How long can you walk comfortably?  unlimited     Diagnostic tests  xray negative for fracture     Patient Stated Goals  Play soccer again, no pain. Complete PT. Be able to run again without pain.    Currently in Pain?  No/denies    Pain Onset  More than a month ago                1030  Objective        No latex band allergies    Pt wants to be able to kick a soccer ball hard to shoot the ball.     Medbridge Access Code [email or web address]   Manual therapy            Prone STM to decrease fascial tension to posterior tibialis, medial and lateral ankle and dorsolateral foot area.              No pain with jogging after manual therapy and eccentric loading exercises.    Prone with R knee bent: effleurage to decrease R lateral ankle swelling.    Therapeutic exercise   (shoes off)   Standing eccentric heel raise 10x. Symptoms  at end range PF position, very light easing of symptoms   Sitting R ankle eccentric PF with knee bent 10x   Squatting on BOSU with attention to lift archs x10, pt reported medial ankle pain/discomfort (black side up)  Single leg stance on BOSU 3x30sec ea side  black side up  Lunges on blue side forward and lateral lunges x12 ea side (pt reported medial discomfort in R ankle while ankle is on BOSU during forward lunges)   Treadmill running with shoes on, inserts in place x 8mins, 6.5 MPH. Iniital take off pain, but no further R ankle pain     Improved exercise technique, movement at target joints, use of target muscles after mod verbal, visual, tactile cues.     Pt response/clinical impression: The patient reported discomfort in ankle with initial push off when starting run on treadmill, no further discomfort reported. Pt with intermittent reports of intermittent ankle pain, especially with DF + arch collapse in barefoot, and end range PF. Pt instructed to begin to play with soccer ball at home, 50% kicks and volleys to begin  gradual return to sport. Also instructed to perform HEP bilaterally.       PT Education - 08/26/19 1038    Education Details  ther-ex    Person(s) Educated  Patient    Methods  Explanation;Tactile cues;Demonstration;Verbal cues    Comprehension  Verbalized understanding;Returned demonstration       PT Short Term Goals - 07/19/19 1527      PT SHORT TERM GOAL #1   Title  Patient will be independent with his HEP to decrease pain, and improve ability to run and jump without pain.    Time  3    Period  Weeks    Status  New    Target Date  08/12/19        PT Long Term Goals - 07/19/19 1527      PT LONG TERM GOAL #1   Title  Patient will have a decrease in R ankle pain to 1/10 or less at worst to promote ability to run, jump, and exercise more comfortably.    Baseline  2/10 R ankle pain at most for the past 3 weeks (higher pain level if pt runs for greater than 1 minute; 07/19/2019)    Time  6    Period  Weeks    Status  New    Target Date  09/02/19      PT LONG TERM GOAL #2   Title  Patient will be able to run for at least 15 minutes without complain of R ankle pain to promote cardiovascular fitness and return to soccer.    Baseline  Pain increases to at least 2/10 when running for about 1 minute (3/22/201)    Time  6    Period  Weeks    Status  New    Target Date  09/02/19      PT LONG TERM GOAL #3   Title  Patient will improve his R ankle FOTO score by at least 10 points as a demonstration of improved function.    Baseline  R ankle FOTO: 67 (07/19/2019)    Time  6    Period  Weeks    Status  New    Target Date  09/02/19            Plan - 08/26/19 1111    Clinical Impression Statement  The patient reported discomfort in ankle with initial push off when  starting run on treadmill, no further discomfort reported. Pt with intermittent reports of intermittent ankle pain, especially with DF + arch collapse in barefoot, and end range PF. Pt instructed to begin to play  with soccer ball at home, 50% kicks and volleys to begin gradual return to sport. Also instructed to perform HEP bilaterally.    Personal Factors and Comorbidities  Past/Current Experience;Time since onset of injury/illness/exacerbation    Examination-Activity Limitations  Other   running and jumping   Stability/Clinical Decision Making  Stable/Uncomplicated    Rehab Potential  Good    PT Frequency  2x / week    PT Duration  6 weeks    PT Treatment/Interventions  Therapeutic exercise;Therapeutic activities;Functional mobility training;Balance training;Neuromuscular re-education;Patient/family education;Manual techniques;Dry needling;Taping;Spinal Manipulations;Joint Manipulations;Aquatic Therapy;Electrical Stimulation;Iontophoresis 4mg /ml Dexamethasone;Ultrasound    PT Next Visit Plan  concentric, eccentric glute and ankle strengthening, ankle stability, manual techniques, modalities PRN    PT Home Exercise Plan  continue with previous, add proprioceptive exercises on unstable surfaces, skipping, jogging    Consulted and Agree with Plan of Care  Patient       Patient will benefit from skilled therapeutic intervention in order to improve the following deficits and impairments:  Pain, Postural dysfunction, Improper body mechanics, Decreased strength  Visit Diagnosis: Pain in right ankle and joints of right foot  Stiffness of right ankle, not elsewhere classified     Problem List There are no problems to display for this patient.   PT, DPT 11:41 AM,08/26/19   [facility] [address], [place] Phone: [phone]   Fax:  [phone]  Name: Preston Salinas MRN: Cassandria Anger Date of Birth: [birth year]

## 2019-08-30 ENCOUNTER — Ambulatory Visit: Payer: 59 | Attending: Sports Medicine

## 2019-08-30 ENCOUNTER — Other Ambulatory Visit: Payer: Self-pay

## 2019-08-30 DIAGNOSIS — M25571 Pain in right ankle and joints of right foot: Secondary | ICD-10-CM | POA: Insufficient documentation

## 2019-08-30 DIAGNOSIS — M25671 Stiffness of right ankle, not elsewhere classified: Secondary | ICD-10-CM | POA: Diagnosis present

## 2019-08-30 NOTE — Patient Instructions (Signed)
Standing eccentric heel raise, slow lowering with most weight on R LE 10x3  Reviewed and given as part of HEP. Pt demonstrated and verbalized understanding.

## 2019-08-30 NOTE — Therapy (Signed)
[facility] PHYSICAL AND SPORTS MEDICINE [address] [place], [address] Phone: [phone]   Fax:  [phone]  Physical Therapy Treatment  Patient Details  Name: Preston Salinas MRN: [id number] Date of Birth: 1996-03-16 Referring Provider (PT): Dorthula Nettles, DO   Encounter Date: 08/30/2019  PT End of Session - 08/30/19 1451    Visit Number  8    Number of Visits  13    Date for PT Re-Evaluation  09/02/19    PT Start Time  1451    PT Stop Time  1532    PT Time Calculation (min)  41 min    Activity Tolerance  Patient tolerated treatment well    Behavior During Therapy  [facility] for tasks assessed/performed       Past Medical History:  Diagnosis Date  . Allergy   . Anxiety 02/11/2019    No past surgical history on file.  There were no vitals filed for this visit.  Subjective Assessment - 08/30/19 1452    Subjective  R foot is ok. Running is better. Played soccer and foot felt ok. Had R tibialis posterior tendon discomfort. Karyl Kinnier it was ok.    Pertinent History  Chronic R ankle pain. Pt injured his R ankle around 2019 and reinjured it June 11, 2019. Pain has improved quite a bit but still gives him discomfort when running. Pt still goes to the gym and is fine. Does squats, deadlifts, lunges. Can run at the treadmill for about a minute until his R ankle bothers him. A brisk walk is fine. Pt injured R ankle playing soccer and his R foot got stuck in a pot hole in mud.  The PT in 2019 helped with his R ankle. Has not yet had PT for his reinjury. Was told that he has a sprain. No tears or fractures. R ankle swelled up at time of injury.    Limitations  Other (comment)   unable to run or play   How long can you sit comfortably?  unlimited     How long can you stand comfortably?  unlimited     How long can you walk comfortably?  unlimited     Diagnostic tests  xray negative for fracture     Patient Stated Goals  Play soccer again,  no pain. Complete PT. Be able to run again without pain.    Currently in Pain?  No/denies    Pain Score  0-No pain    Pain Onset  More than a month ago                               PT Education - 08/30/19 1524    Education Details  ther-ex    [employer]) Educated  Patient    Methods  Explanation;Demonstration;Tactile cues;Verbal cues    Comprehension  Returned demonstration;Verbalized understanding      Objective     No latex band allergies  Pt wants to be able to kick a soccer ball hard to shoot the ball.  MedbridgeAccess Code [email or web address]  Manual therapy  Prone STM to decrease fascial tension toposterior tibialis,medialand lateralankle and dorsolateral footarea.  No pain with jogging after manual therapy and eccentric loading exercises.  Prone with R knee bent: effleurage to decrease R lateral ankle swelling.  Therapeutic exercise  Prone R ankle eccentric PF with knee bent 10x3  Prone R ankle eccentric PF with IV to target tibialis  posterior 10x3  Jog x 100 ft clockwise. R medial ankle discomfort  Then 100 ft counter clockwise. R medial ankle discomfort  Standing eccentric heel raise, slow lowering with most weight on R LE 10x3  Reviewed and given as part of HEP. Pt demonstrated and verbalized understanding.    After eccentric heel raise: Jog x 100 ft clockwise 2x. Minimal discomfort  Then 100 ft counter clockwise. No medial ankle discomfort  Single leg deadlift R LE 10x  Work on ankle stability next visit if appropriate.   Improved exercise technique, movement at target joints, use of target muscles after mod verbal, visual, tactile cues.    Pt response/clinical impression:   Decreased R medial ankle pain with initial jogging after closed chain eccentric tibialis posterior and achilles muscle contraction. Symptoms return with increased jogging. Pt tolerated session well without aggravation of  symptoms. Pt will benefit from continued skilled physical therapy services to decrease pain, improve strength, and ability to play soccer without pain.   PT Short Term Goals - 07/19/19 1527      PT SHORT TERM GOAL #1   Title  Patient will be independent with his HEP to decrease pain, and improve ability to run and jump without pain.    Time  3    Period  Weeks    Status  New    Target Date  08/12/19        PT Long Term Goals - 07/19/19 1527      PT LONG TERM GOAL #1   Title  Patient will have a decrease in R ankle pain to 1/10 or less at worst to promote ability to run, jump, and exercise more comfortably.    Baseline  2/10 R ankle pain at most for the past 3 weeks (higher pain level if pt runs for greater than 1 minute; 07/19/2019)    Time  6    Period  Weeks    Status  New    Target Date  09/02/19      PT LONG TERM GOAL #2   Title  Patient will be able to run for at least 15 minutes without complain of R ankle pain to promote cardiovascular fitness and return to soccer.    Baseline  Pain increases to at least 2/10 when running for about 1 minute (3/22/201)    Time  6    Period  Weeks    Status  New    Target Date  09/02/19      PT LONG TERM GOAL #3   Title  Patient will improve his R ankle FOTO score by at least 10 points as a demonstration of improved function.    Baseline  R ankle FOTO: 67 (07/19/2019)    Time  6    Period  Weeks    Status  New    Target Date  09/02/19            Plan - 08/30/19 1526    Clinical Impression Statement  Decreased R medial ankle pain with initial jogging after closed chain eccentric tibialis posterior and achilles muscle contraction. Symptoms return with increased jogging. Pt tolerated session well without aggravation of symptoms. Pt will benefit from continued skilled physical therapy services to decrease pain, improve strength, and ability to play soccer without pain.    Personal Factors and Comorbidities  Past/Current Experience;Time  since onset of injury/illness/exacerbation    Examination-Activity Limitations  Other   running and jumping   Stability/Clinical Decision Making  Stable/Uncomplicated    Rehab Potential  Good    PT Frequency  2x / week    PT Duration  6 weeks    PT Treatment/Interventions  Therapeutic exercise;Therapeutic activities;Functional mobility training;Balance training;Neuromuscular re-education;Patient/family education;Manual techniques;Dry needling;Taping;Spinal Manipulations;Joint Manipulations;Aquatic Therapy;Electrical Stimulation;Iontophoresis 4mg /ml Dexamethasone;Ultrasound    PT Next Visit Plan  concentric, eccentric glute and ankle strengthening, ankle stability, manual techniques, modalities PRN    PT Home Exercise Plan  continue with previous, add proprioceptive exercises on unstable surfaces, skipping, jogging    Consulted and Agree with Plan of Care  Patient       Patient will benefit from skilled therapeutic intervention in order to improve the following deficits and impairments:  Pain, Postural dysfunction, Improper body mechanics, Decreased strength  Visit Diagnosis: Pain in right ankle and joints of right foot  Stiffness of right ankle, not elsewhere classified     Problem List There are no problems to display for this patient.   PT, DPT   08/30/2019, 4:53 PM  [facility] PHYSICAL AND SPORTS MEDICINE [address], [place] Phone: [phone]   Fax:  [phone]  Name: Braydn Carneiro MRN: Cassandria Anger Date of Birth: [birth year]

## 2019-09-01 ENCOUNTER — Ambulatory Visit: Payer: 59

## 2019-09-09 ENCOUNTER — Other Ambulatory Visit: Payer: Self-pay

## 2019-09-09 ENCOUNTER — Ambulatory Visit: Payer: 59

## 2019-09-09 DIAGNOSIS — M25671 Stiffness of right ankle, not elsewhere classified: Secondary | ICD-10-CM

## 2019-09-09 DIAGNOSIS — M25571 Pain in right ankle and joints of right foot: Secondary | ICD-10-CM | POA: Diagnosis not present

## 2019-09-09 NOTE — Patient Instructions (Signed)
Access Code: [email or web address] URL: [email or web address] Date: 08/11/2019 Prepared by: Loralyn Freshwater  Exercises Isometric Ankle Inversion at Wall - 3 x daily - 7 x weekly - 1 sets - 5 reps - 1 minute hold Standing Eccentric Heel Raise - 1 x daily - 7 x weekly - 3 sets - 10 reps

## 2019-09-09 NOTE — Therapy (Signed)
[place] PHYSICAL AND SPORTS MEDICINE [address], [place], [address] Phone: [phone]   Fax:  [phone]  Physical Therapy Treatment  Patient Details  Name: Preston Salinas MRN: [id number] Date of Birth: Aug 15, 1995 Referring Provider (PT): Rosalia Hammers, DO   Encounter Date: 09/09/2019  PT End of Session - 09/09/19 0819    Visit Number  9    Number of Visits  13    Date for PT Re-Evaluation  09/23/19    PT Start Time  0819    PT Stop Time  0913    PT Time Calculation (min)  54 min    Activity Tolerance  Patient tolerated treatment well    Behavior During Therapy  [facility] for tasks assessed/performed       Past Medical History:  Diagnosis Date  . Allergy   . Anxiety 02/11/2019    No past surgical history on file.  There were no vitals filed for this visit.  Subjective Assessment - 09/09/19 0820    Subjective  R ankle is feeling a lot better now in terms of pain when running. No pain currently. 2/10 at most for the past 7 days. Initial push-off is not as intense as it was. Pt able to jog about 10 minutes with only slight pain 3/10. Stopped jogging because he got tired. Pt was only able to jog about 5 minutes before starting PT which brought his ankle pain up to an 8/10. Pt feels like he is progressing slowy but surely. Can see the progress but is not there yet. Feels like he needs more PT in the clinic. Going back home after school. 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 None are in [place]. The swelling is better.    Pertinent History  Chronic R ankle pain. Pt injured his R ankle around 2019 and reinjured it June 11, 2019. Pain has improved quite a bit but still gives him discomfort when running. Pt still goes to the gym and is fine. Does squats, deadlifts, lunges. Can run at the treadmill for about a minute until his R ankle bothers him. A brisk walk is fine. Pt injured R ankle playing  soccer and his R foot got stuck in a pot hole in mud.  The PT in 2019 helped with his R ankle. Has not yet had PT for his reinjury. Was told that he has a sprain. No tears or fractures. R ankle swelled up at time of injury.    Limitations  Other (comment)   unable to run or play   How long can you sit comfortably?  unlimited     How long can you stand comfortably?  unlimited     How long can you walk comfortably?  unlimited     Diagnostic tests  xray negative for fracture     Patient Stated Goals  Play soccer again, no pain. Complete PT. Be able to run again without pain.    Currently in Pain?  No/denies    Pain Score  0-No pain    Pain Onset  More than a month ago                                PT Education - 09/09/19 0924    Education Details  ther-ex, HEP    Person(s) Educated  Patient    Methods  Explanation;Demonstration;Tactile  cues;Verbal cues    Comprehension  Verbalized understanding;Returned demonstration       Objective     No latex band allergies  Pt wants to be able to kick a soccer ball hard to shoot the ball.  MedbridgeAccess Code [email or web address]  Manual therapy  With edge tool Prone STM to decrease fascial tension toposterior tibialis,medialand lateralankle, Achilles tendon, medial gastroc muscle   Prone with R knee bent: effleurage to decrease R lateral ankle swelling.     Therapeutic exercise  Reviewed plan of care: continue until 09/21/2019 to promote progress until pt leaves for home or internship  Jog clockwise around gym 5 laps, very slight pain at initial push off (1/10), some pain during turns (3/10 lateral ankle). Otherwise good  Jog counterclockwise around gym 5 laps. 0.5/10 at initial push-off, 2/10 during turns  Prone R ankle eccentric PF with IV to target tibialis posterior 10x3 manually resisted  Eccentric heel raises 10x3  Standing squats B feet on upside down BOSU 10x with 3 kg ball toss    R SLS on upside down BOSU 30 seconds   Then with 3 kg ball toss 30 seconds x 2  Jog 5 laps clockwise. No pain at initial push-off. Felt 1-2/10 symptoms at one of the push-offs to increase speed. No pain during the turns    Improved exercise technique, movement at target joints, use of target muscles after min verbal, visual, tactile cues.    Pt response/clinical impression:  Pt demonstrates improved ability to jog for longer distances and times improving from 5 minutes to 10 minutes with pain level decreasing from 8/10 to 3/10 compared to before starting PT based on subjective reports. Continued working on decreasing soft tissue restrictions around ankle and promoting weight bearing tolerance to R tibialis posterior tendon. Decreased symptoms with jogging after session. Pt will benefit from continued skilled physical therapy services to decrease pain, improve load tolerance to his R ankle, decrease swelling, and improve function.         PT Short Term Goals - 09/09/19 0926      PT SHORT TERM GOAL #1   Title  Patient will be independent with his HEP to decrease pain, and improve ability to run and jump without pain.    Baseline  Pt performing his HEP independently (09/09/2019)    Time  3    Period  Weeks    Status  Achieved    Target Date  08/12/19        PT Long Term Goals - 09/09/19 0927      PT LONG TERM GOAL #1   Title  Patient will have a decrease in R ankle pain to 1/10 or less at worst to promote ability to run, jump, and exercise more comfortably.    Baseline  2/10 R ankle pain at most for the past 3 weeks (higher pain level if pt runs for greater than 1 minute; 07/19/2019); 2-3/10 at most for the past 7 days, pt able to run at least 10 minutes (09/09/2019)    Time  2    Period  Weeks    Status  Partially Met    Target Date  09/23/19      PT LONG TERM GOAL #2   Title  Patient will be able to run for at least 15 minutes without complain of R ankle pain to promote  cardiovascular fitness and return to soccer.    Baseline  Pain increases to at least 2/10 when running for about  1 minute (3/22/201); Able to run 10 minutes with 3/10 pain (09/09/2019)    Time  2    Period  Weeks    Status  Partially Met    Target Date  09/23/19      PT LONG TERM GOAL #3   Title  Patient will improve his R ankle FOTO score by at least 10 points as a demonstration of improved function.    Baseline  R ankle FOTO: 67 (07/19/2019)    Time  2    Period  Weeks    Status  On-going    Target Date  09/23/19            Plan - 09/09/19 0925    Clinical Impression Statement  Pt demonstrates improved ability to jog for longer distances and times improving from 5 minutes to 10 minutes with pain level decreasing from 8/10 to 3/10 compared to before starting PT based on subjective reports. Continued working on decreasing soft tissue restrictions around ankle and promoting weight bearing tolerance to R tibialis posterior tendon. Decreased symptoms with jogging after session. Pt will benefit from continued skilled physical therapy services to decrease pain, improve load tolerance to his R ankle, decrease swelling, and improve function.    Personal Factors and Comorbidities  Past/Current Experience;Time since onset of injury/illness/exacerbation    Examination-Activity Limitations  Other   running and jumping   Stability/Clinical Decision Making  Stable/Uncomplicated    Rehab Potential  Good    PT Frequency  2x / week    PT Duration  2 weeks    PT Treatment/Interventions  Therapeutic exercise;Therapeutic activities;Functional mobility training;Balance training;Neuromuscular re-education;Patient/family education;Manual techniques;Dry needling;Taping;Spinal Manipulations;Joint Manipulations;Aquatic Therapy;Electrical Stimulation;Iontophoresis 83m/ml Dexamethasone;Ultrasound    PT Next Visit Plan  concentric, eccentric glute and ankle strengthening, ankle stability, manual techniques,  modalities PRN    PT Home Exercise Plan  continue with previous, add proprioceptive exercises on unstable surfaces, skipping, jogging    Consulted and Agree with Plan of Care  Patient       Patient will benefit from skilled therapeutic intervention in order to improve the following deficits and impairments:  Pain, Postural dysfunction, Improper body mechanics, Decreased strength  Visit Diagnosis: Pain in right ankle and joints of right foot - Plan: PT plan of care cert/re-cert  Stiffness of right ankle, not elsewhere classified - Plan: PT plan of care cert/re-cert     Problem List There are no problems to display for this patient.   MJoneen BoersPT, DPT  09/09/2019, 9:39 AM  [facility] [address] [place] [address]Phone: [phone]  Fax:  [phone] Name: Preston ArmwoodMRN: [id number] of Birth: 8Feb 14, 1997

## 2019-09-16 ENCOUNTER — Ambulatory Visit: Payer: 59

## 2019-09-16 ENCOUNTER — Other Ambulatory Visit: Payer: Self-pay

## 2019-09-16 DIAGNOSIS — M25571 Pain in right ankle and joints of right foot: Secondary | ICD-10-CM | POA: Diagnosis not present

## 2019-09-16 DIAGNOSIS — M25671 Stiffness of right ankle, not elsewhere classified: Secondary | ICD-10-CM

## 2019-09-16 NOTE — Therapy (Signed)
[place] PHYSICAL AND SPORTS MEDICINE [address] [place], [address] Phone: [phone]   Fax:  [phone]  Physical Therapy Treatment  Patient Details  Name: Preston Salinas MRN: [id number] Date of Birth: Nov 01, 1995 Referring Provider (PT): Rosalia Hammers, DO   Encounter Date: 09/16/2019  PT End of Session - 09/16/19 1033    Visit Number  10    Number of Visits  13    Date for PT Re-Evaluation  09/23/19    PT Start Time  1034    PT Stop Time  1116    PT Time Calculation (min)  42 min    Activity Tolerance  Patient tolerated treatment well    Behavior During Therapy  [facility] for tasks assessed/performed       Past Medical History:  Diagnosis Date  . Allergy   . Anxiety 02/11/2019    No past surgical history on file.  There were no vitals filed for this visit.  Subjective Assessment - 09/16/19 1035    Subjective  R ankle is the same.    Pertinent History  Chronic R ankle pain. Pt injured his R ankle around 2019 and reinjured it June 11, 2019. Pain has improved quite a bit but still gives him discomfort when running. Pt still goes to the gym and is fine. Does squats, deadlifts, lunges. Can run at the treadmill for about a minute until his R ankle bothers him. A brisk walk is fine. Pt injured R ankle playing soccer and his R foot got stuck in a pot hole in mud.  The PT in 2019 helped with his R ankle. Has not yet had PT for his reinjury. Was told that he has a sprain. No tears or fractures. R ankle swelled up at time of injury.    Limitations  Other (comment)   unable to run or play   How long can you sit comfortably?  unlimited     How long can you stand comfortably?  unlimited     How long can you walk comfortably?  unlimited     Diagnostic tests  xray negative for fracture     Patient Stated Goals  Play soccer again, no pain. Complete PT. Be able to run again without pain.    Pain Onset  More than a month ago                                 PT Education - 09/16/19 1038    Education Details  ther-ex    [employer]) Educated  Patient    Methods  Explanation;Demonstration;Tactile cues;Verbal cues    Comprehension  Returned demonstration;Verbalized understanding      Objective     No latex band allergies  Pt wants to be able to kick a soccer ball hard to shoot the ball.  MedbridgeAccess Code [email or web address]  Manual therapy  With edge tool Prone STM to decrease fascial tension toposterior tibialis,medialand lateralankle, Achilles tendon, medial gastroc muscle  Prone with R knee bent: effleurage to decrease R lateral ankle swelling.   Last scheduled visit: 09/21/19 (graduation day)   Therapeutic exercise  Jog clockwise around gym 5 laps, (no pain with push-off), No pain during turns, slight symptoms when trying to go faster, 1/10   Jog counterclockwise around gym 5 laps. No pain at initial push-off, No pain during turns, slight pain when trying to go faster   Improved since last  session   SLS on Dyna disc with R LE with one UE light touch assist if needed 1 minute   Then with 3 kg ball toss 10x  Jog clockwise around gym 5 laps, (no pain with push-off), No pain during turns, no symptoms when trying to go faster      Improved exercise technique, movement at target joints, use of target muscles after min verbal, visual, tactile cues.   Pt response/clinical impression: Continued working on improving fascial mobility around his leg and ankle as well as applying eccentric loading to the tibialis tendon to promote healing. Also worked on stability exercises to decrease risk if spraining his ankle in the future. No pain after session with jogging 5 laps clockwise around the gym. Pt will benefit from continued skilled physical therapy services to decrease pain improve strength, function, and ability to run without pain.    PT Short  Term Goals - 09/09/19 0926      PT SHORT TERM GOAL #1   Title  Patient will be independent with his HEP to decrease pain, and improve ability to run and jump without pain.    Baseline  Pt performing his HEP independently (09/09/2019)    Time  3    Period  Weeks    Status  Achieved    Target Date  08/12/19        PT Long Term Goals - 09/09/19 0927      PT LONG TERM GOAL #1   Title  Patient will have a decrease in R ankle pain to 1/10 or less at worst to promote ability to run, jump, and exercise more comfortably.    Baseline  2/10 R ankle pain at most for the past 3 weeks (higher pain level if pt runs for greater than 1 minute; 07/19/2019); 2-3/10 at most for the past 7 days, pt able to run at least 10 minutes (09/09/2019)    Time  2    Period  Weeks    Status  Partially Met    Target Date  09/23/19      PT LONG TERM GOAL #2   Title  Patient will be able to run for at least 15 minutes without complain of R ankle pain to promote cardiovascular fitness and return to soccer.    Baseline  Pain increases to at least 2/10 when running for about 1 minute (3/22/201); Able to run 10 minutes with 3/10 pain (09/09/2019)    Time  2    Period  Weeks    Status  Partially Met    Target Date  09/23/19      PT LONG TERM GOAL #3   Title  Patient will improve his R ankle FOTO score by at least 10 points as a demonstration of improved function.    Baseline  R ankle FOTO: 67 (07/19/2019)    Time  2    Period  Weeks    Status  On-going    Target Date  09/23/19            Plan - 09/16/19 1111    Clinical Impression Statement  Continued working on improving fascial mobility around his leg and ankle as well as applying eccentric loading to the tibialis tendon to promote healing. Also worked on stability exercises to decrease risk if spraining his ankle in the future. No pain after session with jogging 5 laps clockwise around the gym. Pt will benefit from continued skilled physical therapy services to  decrease pain  improve strength, function, and ability to run without pain.    Personal Factors and Comorbidities  Past/Current Experience;Time since onset of injury/illness/exacerbation    Examination-Activity Limitations  Other   running and jumping   Stability/Clinical Decision Making  Stable/Uncomplicated    Rehab Potential  Good    PT Frequency  2x / week    PT Duration  2 weeks    PT Treatment/Interventions  Therapeutic exercise;Therapeutic activities;Functional mobility training;Balance training;Neuromuscular re-education;Patient/family education;Manual techniques;Dry needling;Taping;Spinal Manipulations;Joint Manipulations;Aquatic Therapy;Electrical Stimulation;Iontophoresis 48m/ml Dexamethasone;Ultrasound    PT Next Visit Plan  concentric, eccentric glute and ankle strengthening, ankle stability, manual techniques, modalities PRN    PT Home Exercise Plan  continue with previous, add proprioceptive exercises on unstable surfaces, skipping, jogging    Consulted and Agree with Plan of Care  Patient       Patient will benefit from skilled therapeutic intervention in order to improve the following deficits and impairments:  Pain, Postural dysfunction, Improper body mechanics, Decreased strength  Visit Diagnosis: Pain in right ankle and joints of right foot  Stiffness of right ankle, not elsewhere classified     Problem List There are no problems to display for this patient.  MJoneen BoersPT, DPT   09/16/2019, 11:31 AM  [facility] [address] [place] [address]Phone: [phone]  Fax:  [phone] Name: CNicholi GhumanMRN: [id number] of Birth: [birth year]

## 2019-09-21 ENCOUNTER — Ambulatory Visit: Payer: 59

## 2019-09-21 ENCOUNTER — Other Ambulatory Visit: Payer: Self-pay

## 2019-09-21 DIAGNOSIS — M25671 Stiffness of right ankle, not elsewhere classified: Secondary | ICD-10-CM

## 2019-09-21 DIAGNOSIS — M25571 Pain in right ankle and joints of right foot: Secondary | ICD-10-CM

## 2019-09-21 NOTE — Therapy (Signed)
[facility] [address], [place], [address] Phone: [phone]   Fax:  [phone]  Physical Therapy Discharge Summary  Patient Details  Name: Preston Salinas MRN: [id number] Date of Birth: Jun 15, 1995 Referring Provider (PT): Rosalia Hammers, DO   Encounter Date: 09/21/2019    Past Medical History:  Diagnosis Date  . Allergy   . Anxiety 02/11/2019    No past surgical history on file.  There were no vitals filed for this visit.  Subjective Assessment - 09/21/19 1205    Subjective  No show. Called pt on the phone. Pt states that he forgot about the appointment. Currently packing up to go home out of state. 1/10 R ankle pain at most for the past 7 days. Able to run now for 15 minutes with 2/10 ankle pain.    Pertinent History  Chronic R ankle pain. Pt injured his R ankle around 2019 and reinjured it June 11, 2019. Pain has improved quite a bit but still gives him discomfort when running. Pt still goes to the gym and is fine. Does squats, deadlifts, lunges. Can run at the treadmill for about a minute until his R ankle bothers him. A brisk walk is fine. Pt injured R ankle playing soccer and his R foot got stuck in a pot hole in mud.  The PT in 2019 helped with his R ankle. Has not yet had PT for his reinjury. Was told that he has a sprain. No tears or fractures. R ankle swelled up at time of injury.    Limitations  Other (comment)   unable to run or play   How long can you sit comfortably?  unlimited     How long can you stand comfortably?  unlimited     How long can you walk comfortably?  unlimited     Diagnostic tests  xray negative for fracture     Patient Stated Goals  Play soccer again, no pain. Complete PT. Be able to run again without pain.    Pain Onset  More than a month ago                                  PT Short Term Goals - 09/09/19 0926      PT SHORT TERM GOAL  #1   Title  Patient will be independent with his HEP to decrease pain, and improve ability to run and jump without pain.    Baseline  Pt performing his HEP independently (09/09/2019)    Time  3    Period  Weeks    Status  Achieved    Target Date  08/12/19        PT Long Term Goals - 09/21/19 1211      PT LONG TERM GOAL #1   Title  Patient will have a decrease in R ankle pain to 1/10 or less at worst to promote ability to run, jump, and exercise more comfortably.    Baseline  2/10 R ankle pain at most for the past 3 weeks (higher pain level if pt runs for greater than 1 minute; 07/19/2019); 2-3/10 at most for the past 7 days, pt able to run at least 10 minutes (09/09/2019); 1-2/10 at most for the past 7 days (09/21/2019)    Time  2    Period  Weeks    Status  Partially Met   potentially  met goal   Target Date  09/23/19      PT LONG TERM GOAL #2   Title  Patient will be able to run for at least 15 minutes without complain of R ankle pain to promote cardiovascular fitness and return to soccer.    Baseline  Pain increases to at least 2/10 when running for about 1 minute (3/22/201); Able to run 10 minutes with 3/10 pain (09/09/2019); Able to run for 15 minutes, 2/10 ankle pain (09/21/2019)    Time  2    Period  Weeks    Status  Partially Met    Target Date  09/23/19      PT LONG TERM GOAL #3   Title  Patient will improve his R ankle FOTO score by at least 10 points as a demonstration of improved function.    Baseline  R ankle FOTO: 67 (07/19/2019); 77 (09/21/2019)    Time  2    Period  Weeks    Status  Achieved    Target Date  09/23/19            Plan - 09/21/19 1207    Clinical Impression Statement  Pt demonstrates overall decreased R ankle pain, with pt now being able to run for 2 minutes with ony 2/10 ankle pain. Prior to PT, pt reported increased R ankle pain to 2/10 after running for only 1 minute. Pt also demonstrate improved ability to perform functional tasks pertaining to  his ankle based on inmproved FOTO score. Pt has made good progress with PT towards goals and demonstrates independence and consistency with his HEP. Skilled physical therapy services discharged with pt continuing progress with his exercises at home.    Personal Factors and Comorbidities  Past/Current Experience;Time since onset of injury/illness/exacerbation    Examination-Activity Limitations  Other   running and jumping   PT Treatment/Interventions  Therapeutic exercise;Therapeutic activities;Neuromuscular re-education;Patient/family education;Manual techniques    PT Next Visit Plan  Continue progress with his HEP.    Consulted and Agree with Plan of Care  Patient       Patient will benefit from skilled therapeutic intervention in order to improve the following deficits and impairments:  Pain  Visit Diagnosis: Pain in right ankle and joints of right foot  Stiffness of right ankle, not elsewhere classified     Problem List There are no problems to display for this patient.   Thank you for your referral.  Joneen Boers PT, DPT   09/21/2019, 12:14 PM  [facility] [address] [place], [address] Phone: [phone]   Fax:  [phone]  Name: Orin Eberwein MRN: [id number] Date of Birth: [birth year]

## 2019-11-07 IMAGING — MR MR ANKLE*R* W/O CM
5 series · 40 of 40 positions shown · non-contrast
Comparison: Radiographs 01/08/2018.

CLINICAL DATA: Soccer injury 5 months ago. Posterior ankle pain
with activity. No previous relevant surgery.

EXAM:
MRI OF THE RIGHT ANKLE WITHOUT CONTRAST
TECHNIQUE: Multiplanar, multisequence MR imaging of the ankle was performed. No
intravenous contrast was administered.

[Series 3: PD fat-sat · axial · right · 3.0mm · 0.50mm/px · z∈[-43,+97]mm · 9 of 36 slices shown]
[im 1/36]
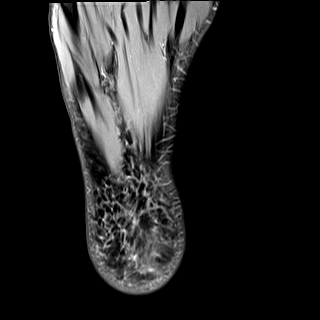
[im 5/36]
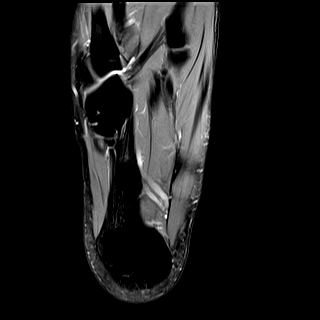
[im 9/36]
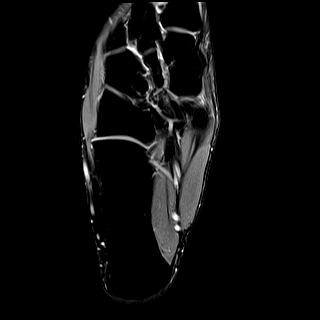
[im 14/36]
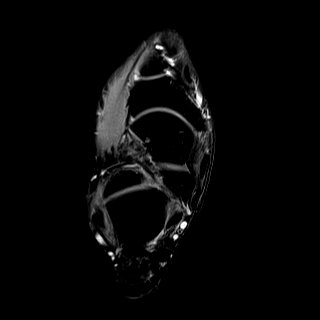
[im 18/36]
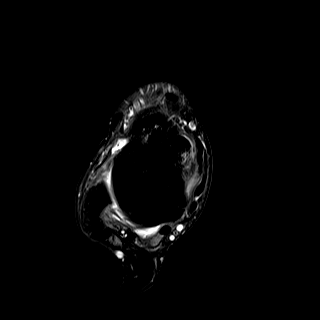
[im 22/36]
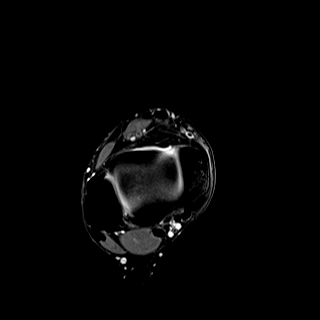
[im 27/36]
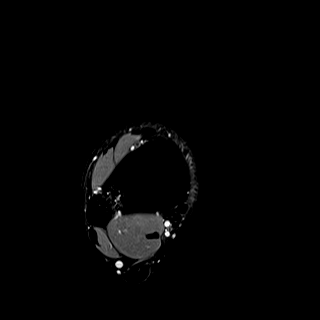
[im 31/36]
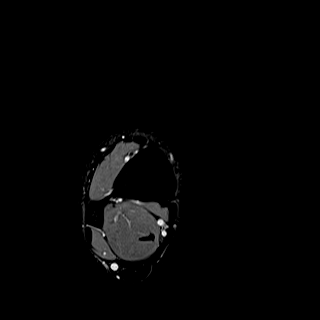
[im 36/36]
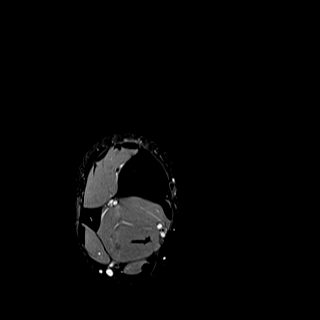

[Series 4: T2 fat-sat · axial · right · 3.0mm · 0.50mm/px · z∈[-43,+97]mm · 10 of 36 slices shown (1 of 2)]
[im 1/36]
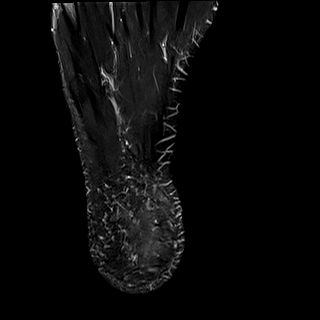
[im 4/36]
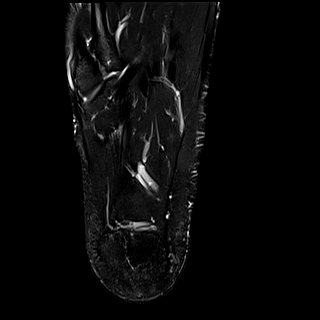
[im 8/36]
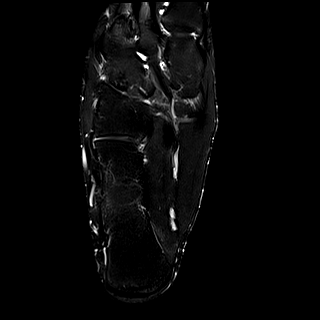
[im 12/36]
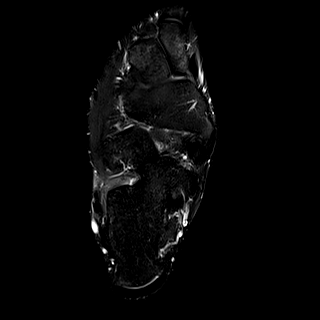
[im 16/36]
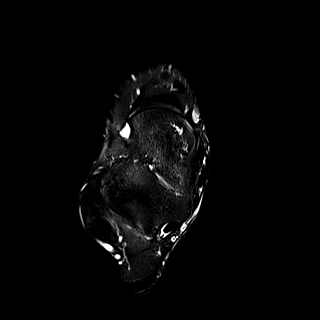
[im 20/36]
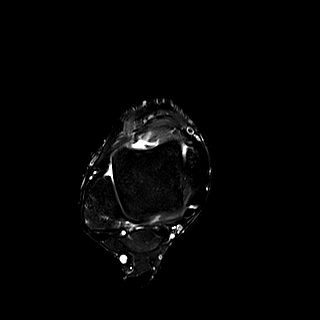
[im 24/36]
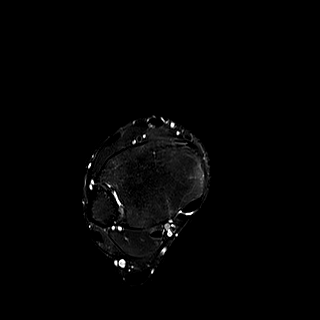
[im 28/36]
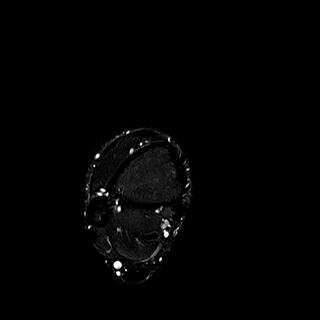
[im 32/36]
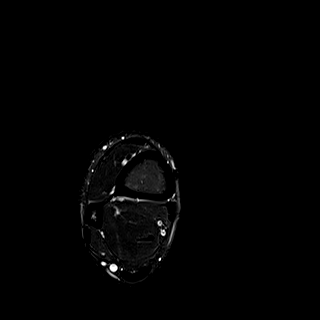
[im 36/36]
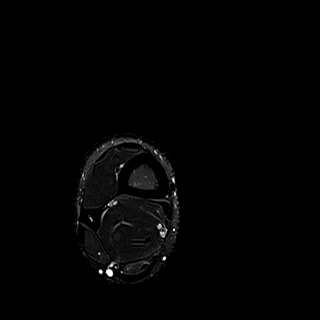

[Series 5: T2 fat-sat · coronal · right · 3.0mm · 0.62mm/px · 11 of 40 slices shown (2 of 2)]
[im 1/40]
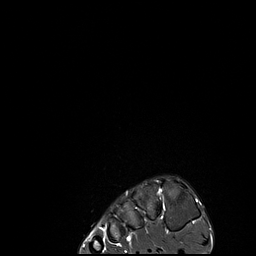
[im 4/40]
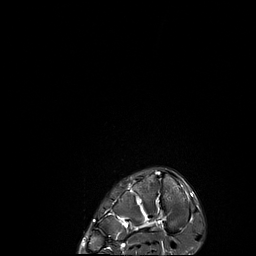
[im 8/40]
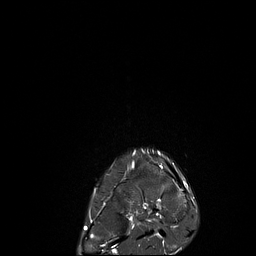
[im 12/40]
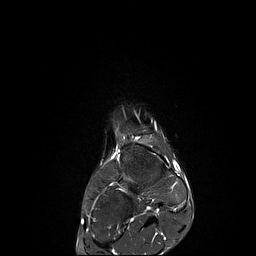
[im 16/40]
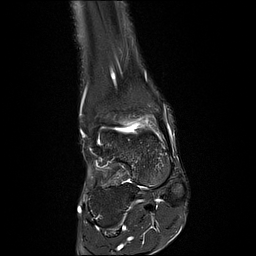
[im 20/40]
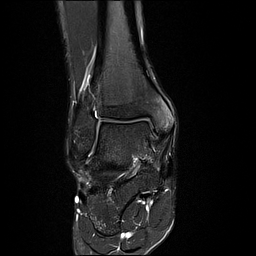
[im 24/40]
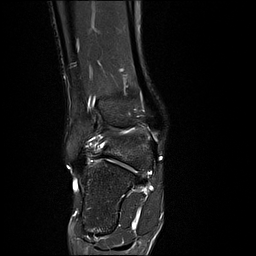
[im 28/40]
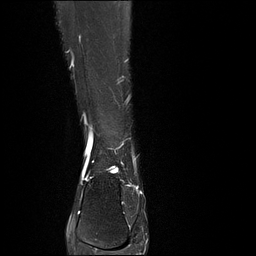
[im 32/40]
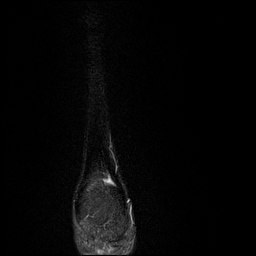
[im 36/40]
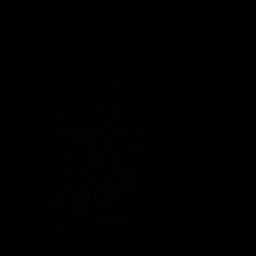
[im 40/40]
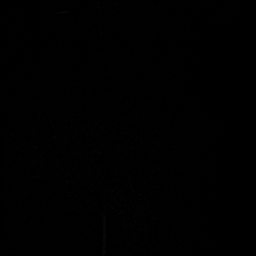

[Series 6: T1 · sagittal · right · 4.0mm · 0.70mm/px · 5 of 18 slices shown]
[im 1/18]
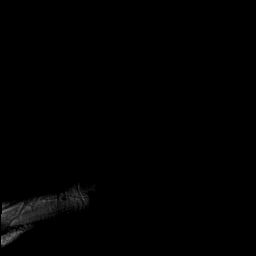
[im 5/18]
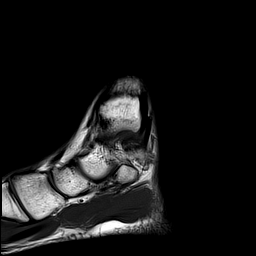
[im 9/18]
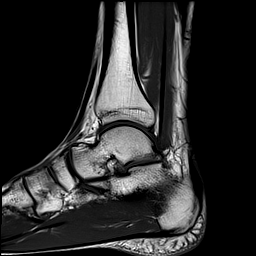
[im 13/18]
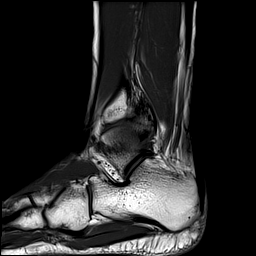
[im 18/18]
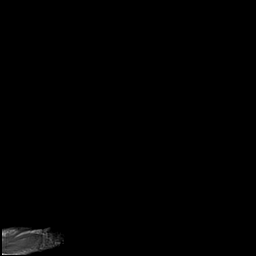

[Series 7: STIR · sagittal · right · 4.0mm · 0.35mm/px · 5 of 18 slices shown]
[im 1/18]
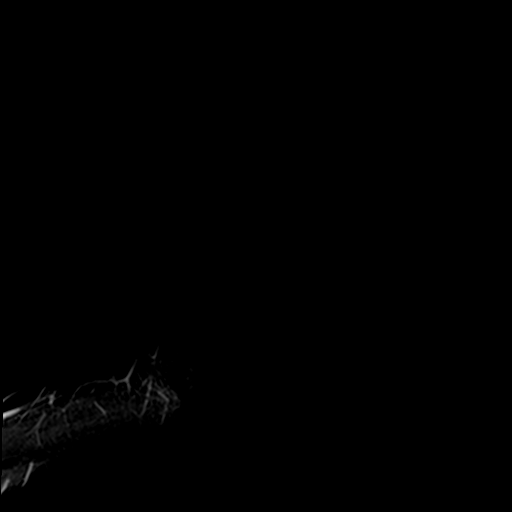
[im 5/18]
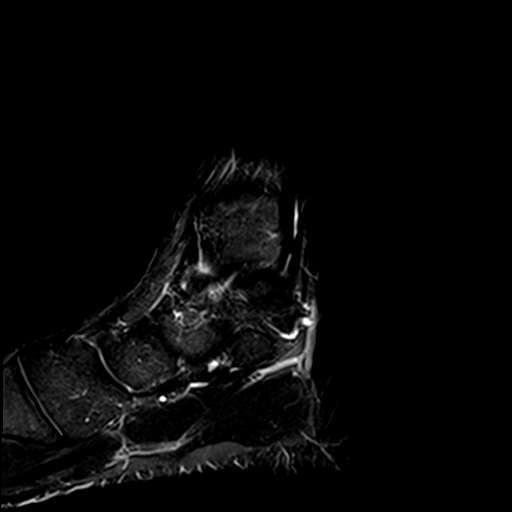
[im 9/18]
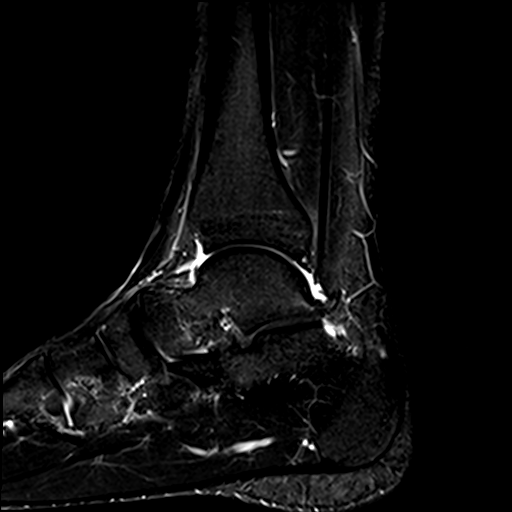
[im 13/18]
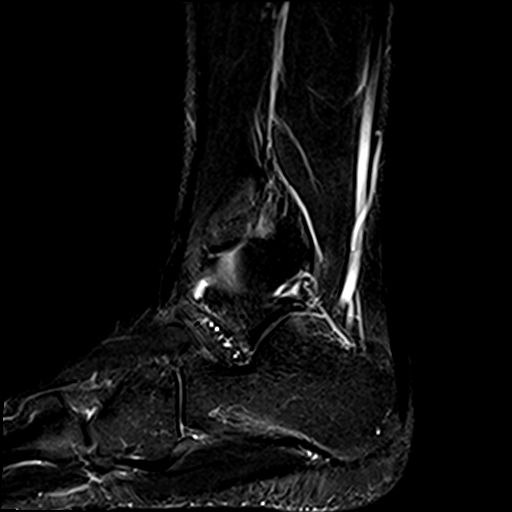
[im 18/18]
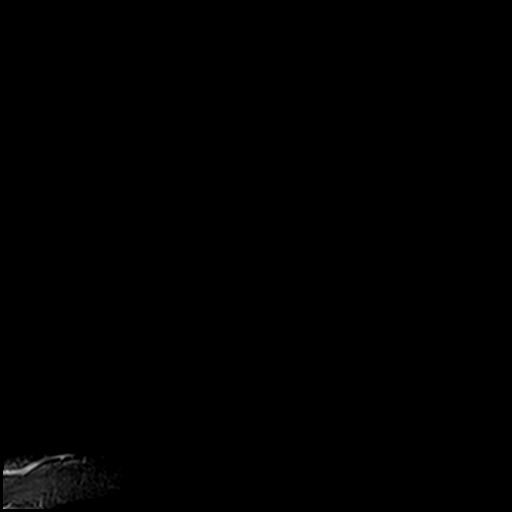

[40 of 40 positions shown; findings below may reference images not displayed]

FINDINGS: TENDONS

Peroneal: Intact and normally positioned.

Posteromedial: Intact and normally positioned.

Anterior: Intact and normally positioned.

Achilles: Intact.

Plantar Fascia: Intact.

LIGAMENTS

Lateral: The anterior talofibular ligament is thickened with soft
tissue thickening extending into the anterolateral gutter and along
the inferior aspect of the fibular tip. The posterior talofibular
and calcaneal fibular ligaments are intact.

Medial: The deltoid and visualized portions of the spring ligament
appear intact.

CARTILAGE AND BONES

Ankle Joint: No significant ankle joint effusion. The talar dome and
tibial plafond are intact.

Subtalar Joints/Sinus Tarsi: Unremarkable.

Bones: No significant extra-articular osseous findings.

Other: No significant soft tissue findings.
IMPRESSION: 1. Thickened anterior talofibular ligament consistent with prior
ligamentous sprain. Associated soft tissue thickening around the
fibular tip, which may contribute to anterolateral impingement.
2. No acute osseous findings.
3. The ankle tendons are intact.

## 2020-02-23 ENCOUNTER — Other Ambulatory Visit: Payer: Self-pay | Admitting: Sports Medicine

## 2020-02-23 DIAGNOSIS — M25562 Pain in left knee: Secondary | ICD-10-CM

## 2020-02-23 DIAGNOSIS — S8992XA Unspecified injury of left lower leg, initial encounter: Secondary | ICD-10-CM

## 2020-02-23 DIAGNOSIS — M25462 Effusion, left knee: Secondary | ICD-10-CM

## 2020-03-08 ENCOUNTER — Ambulatory Visit
Admission: RE | Admit: 2020-03-08 | Discharge: 2020-03-08 | Disposition: A | Discharge status: Home / Self Care | Payer: 59 | Source: Ambulatory Visit | Attending: Sports Medicine | Admitting: Sports Medicine

## 2020-03-08 ENCOUNTER — Other Ambulatory Visit: Payer: Self-pay

## 2020-03-08 DIAGNOSIS — M25462 Effusion, left knee: Secondary | ICD-10-CM | POA: Diagnosis present

## 2020-03-08 DIAGNOSIS — S8992XA Unspecified injury of left lower leg, initial encounter: Secondary | ICD-10-CM | POA: Insufficient documentation

## 2020-03-08 DIAGNOSIS — M25562 Pain in left knee: Secondary | ICD-10-CM
